# Patient Record
Sex: Male | Born: 1935 | Race: White | Hispanic: No | Marital: Married | Smoking: Never smoker
Health system: Southern US, Community
[De-identification: ages and names within clinical notes are randomized; demographics above are authoritative.]

## PROBLEM LIST (undated history)

## (undated) DIAGNOSIS — F0390 Unspecified dementia without behavioral disturbance: Secondary | ICD-10-CM

---

## 2019-10-09 ENCOUNTER — Other Ambulatory Visit: Payer: Self-pay | Admitting: Physician Assistant

## 2019-10-09 DIAGNOSIS — M4807 Spinal stenosis, lumbosacral region: Secondary | ICD-10-CM

## 2019-10-09 DIAGNOSIS — M545 Low back pain, unspecified: Secondary | ICD-10-CM

## 2019-10-20 ENCOUNTER — Ambulatory Visit
Admission: RE | Admit: 2019-10-20 | Discharge: 2019-10-20 | Disposition: A | Discharge status: Home / Self Care | Payer: Medicare Other | Source: Ambulatory Visit | Attending: Physician Assistant | Admitting: Physician Assistant

## 2019-10-20 ENCOUNTER — Other Ambulatory Visit: Payer: Self-pay

## 2019-10-20 DIAGNOSIS — M545 Low back pain, unspecified: Secondary | ICD-10-CM

## 2019-10-20 DIAGNOSIS — M4807 Spinal stenosis, lumbosacral region: Secondary | ICD-10-CM | POA: Insufficient documentation

## 2019-10-30 ENCOUNTER — Other Ambulatory Visit: Payer: Self-pay

## 2019-10-30 ENCOUNTER — Emergency Department
Admission: EM | Admit: 2019-10-30 | Discharge: 2019-10-30 | Disposition: A | Discharge status: Home / Self Care | Payer: Medicare Other | Attending: Emergency Medicine | Admitting: Emergency Medicine

## 2019-10-30 ENCOUNTER — Emergency Department: Payer: Medicare Other

## 2019-10-30 ENCOUNTER — Encounter: Payer: Self-pay | Admitting: Emergency Medicine

## 2019-10-30 DIAGNOSIS — Y998 Other external cause status: Secondary | ICD-10-CM | POA: Insufficient documentation

## 2019-10-30 DIAGNOSIS — Y9389 Activity, other specified: Secondary | ICD-10-CM | POA: Insufficient documentation

## 2019-10-30 DIAGNOSIS — S0990XA Unspecified injury of head, initial encounter: Secondary | ICD-10-CM | POA: Insufficient documentation

## 2019-10-30 DIAGNOSIS — Y92511 Restaurant or cafe as the place of occurrence of the external cause: Secondary | ICD-10-CM | POA: Insufficient documentation

## 2019-10-30 DIAGNOSIS — W010XXA Fall on same level from slipping, tripping and stumbling without subsequent striking against object, initial encounter: Secondary | ICD-10-CM | POA: Insufficient documentation

## 2019-10-30 DIAGNOSIS — F0390 Unspecified dementia without behavioral disturbance: Secondary | ICD-10-CM | POA: Insufficient documentation

## 2019-10-30 DIAGNOSIS — Z87891 Personal history of nicotine dependence: Secondary | ICD-10-CM | POA: Insufficient documentation

## 2019-10-30 NOTE — ED Provider Notes (Signed)
[facility] Emergency Department Provider Note ____________________________________________   First MD Initiated Contact with Patient 10/30/19 1258     (approximate)  I have reviewed the triage vital signs and the nursing notes.   HISTORY  Chief Complaint Fall  Level 5 caveat: History of present illness limited to due to dementia  HPI Ritter Helsley is a 86 y.o. male with a history of dementia who presents after a fall.  The wife states that she went for a walk with the patient and they went to a coffee shop.  The patient was attempting to get up onto a barstool and slipped off of it.  He broke his fall with a walker, but still hit his head on a table.  He had no loss of consciousness.  He has not had any change in mental status.  The patient reports some pain to the right side of his neck where he had a steroid injection yesterday, but denies other acute complaints.  The wife states that the patient has otherwise been feeling fine recently and this appears to be a mechanical fall.  History reviewed. No pertinent past medical history.  There are no problems to display for this patient.   History reviewed. No pertinent surgical history.  Prior to Admission medications   Not on File    Allergies Patient has no known allergies.  History reviewed. No pertinent family history.  Social History Social History   Tobacco Use  . Smoking status: Never Smoker  . Smokeless tobacco: Former Engineer, water Use Topics  . Alcohol use: Not on file  . Drug use: Not on file    Review of Systems Level 5 caveat: Unable to obtain review of systems due to dementia    ____________________________________________   PHYSICAL EXAM:  VITAL SIGNS: ED Triage Vitals  Enc Vitals Group     BP 10/30/19 1235 (!) 125/99     Pulse Rate 10/30/19 1235 84     Resp 10/30/19 1235 16     Temp 10/30/19 1235 98.2 F (36.8 C)     Temp Source 10/30/19 1235 Oral     SpO2  10/30/19 1235 98 %     Weight 10/30/19 1236 189 lb (85.7 kg)     Height 10/30/19 1236 5\' 9"  (1.753 m)     Head Circumference --      Peak Flow --      Pain Score 10/30/19 1236 5     Pain Loc --      Pain Edu? --      Excl. in GC? --     Constitutional: Alert, slightly confused.  Well appearing for age and in no acute distress. Eyes: Conjunctivae are normal.  EOMI.  PERRLA. Head: Atraumatic. Nose: No congestion/rhinnorhea. Mouth/Throat: Mucous membranes are moist.   Neck: Normal range of motion.  No midline cervical spinal tenderness. Cardiovascular: Normal rate, regular rhythm.  Good peripheral circulation. Respiratory: Normal respiratory effort.  No retractions.  Gastrointestinal: No distention.  Musculoskeletal: No lower extremity edema.  Full range of motion all extremities.  No midline spinal tenderness. Neurologic:  Normal speech and language.  Motor intact in all extremities.  Normal coordination with no ataxia on finger-to-nose. Skin:  Skin is warm and dry. No rash noted. Psychiatric: Calm and cooperative. ____________________________________________   LABS (all labs ordered are listed, but only abnormal results are displayed)  Labs Reviewed - No data to display ____________________________________________  EKG   ____________________________________________  RADIOLOGY  CT head:  No ICH CT cervical spine: No acute fracture  ____________________________________________   PROCEDURES  Procedure(s) performed: No  Procedures  Critical Care performed: No ____________________________________________   INITIAL IMPRESSION / ASSESSMENT AND PLAN / ED COURSE  Pertinent labs & imaging results that were available during my care of the patient were reviewed by me and considered in my medical decision making (see chart for details).  86 year old male with a history of dementia presents after mechanical fall from a stool in which he hit his head but had no LOC.  Per  the wife, he is currently at his baseline mental status.  The patient reports some neck pain after a steroid injection yesterday, but denies other acute complaints.  Per his wife, he has been feeling well and at his baseline in the last few days.  He is not on any anticoagulation or antiplatelet therapy.  On exam, the patient is very well-appearing for his age.  His vital signs are normal.  He is slightly confused but able to answer some questions and follow commands.  Neurologic exam is nonfocal.  He has no midline spinal tenderness, and no visible trauma.  Overall presentation is consistent with mechanical fall.  We have obtain CT head and C-spine.  There is no indication for lab work-up at this time.  ----------------------------------------- 2:38 PM on 10/30/2019 -----------------------------------------  CT head and C-spine were negative for acute findings.  The subcutaneous emphysema seen in the right sternocleidomastoid is consistent with the location of his steroid injection yesterday.  The patient has known chronic arthritis.  At this time, the patient is stable for discharge home.  I counseled the patient and his wife on the results of the imaging.  Return precautions given, and they expressed understanding.  ____________________________________________   FINAL CLINICAL IMPRESSION(S) / ED DIAGNOSES  Final diagnoses:  Minor head injury, initial encounter      NEW MEDICATIONS STARTED DURING THIS VISIT:  There are no discharge medications for this patient.    Note:  This document was prepared using Dragon voice recognition software and may include unintentional dictation errors.   Arta Silence, MD 10/30/19 1439

## 2019-10-30 NOTE — ED Notes (Signed)
E-signature not working at this time. Pt verbalized understanding of D/C instructions, prescriptions and follow up care with no further questions at this time. Pt in NAD and wheeled to lobby at time of D/C. Pt wife taking pt home.

## 2019-10-30 NOTE — Discharge Instructions (Addendum)
Return to the ER for new, worsening, or persistent severe headache or neck pain, confusion, change in mental status, vomiting, or any other new or worsening symptoms that concern you.

## 2019-10-30 NOTE — ED Triage Notes (Signed)
Pt presents via [facility] with c/o fall. Pt was sitting on bar stool at coffee shop and slipped while trying to get down from stool. Pt hit head with fall. No lacerations noted at this time. Pt confused, but per ems this is pt's baseline. Pt has hx of alzheimer's and confused at baseline. CBG 96. Pt has chronic neck pain per ems. VSS. Pt alert and oriented x2 and following commands at this time.

## 2019-10-30 NOTE — ED Notes (Signed)
Urine sample sent to lab

## 2020-08-02 ENCOUNTER — Other Ambulatory Visit: Payer: Self-pay

## 2020-08-02 ENCOUNTER — Emergency Department: Payer: Medicare Other

## 2020-08-02 ENCOUNTER — Emergency Department
Admission: EM | Admit: 2020-08-02 | Discharge: 2020-08-02 | Disposition: A | Discharge status: Home / Self Care | Payer: Medicare Other | Attending: Emergency Medicine | Admitting: Emergency Medicine

## 2020-08-02 DIAGNOSIS — S42402A Unspecified fracture of lower end of left humerus, initial encounter for closed fracture: Secondary | ICD-10-CM | POA: Diagnosis not present

## 2020-08-02 DIAGNOSIS — S59902A Unspecified injury of left elbow, initial encounter: Secondary | ICD-10-CM | POA: Diagnosis present

## 2020-08-02 DIAGNOSIS — F0390 Unspecified dementia without behavioral disturbance: Secondary | ICD-10-CM | POA: Insufficient documentation

## 2020-08-02 DIAGNOSIS — W19XXXA Unspecified fall, initial encounter: Secondary | ICD-10-CM | POA: Insufficient documentation

## 2020-08-02 DIAGNOSIS — R079 Chest pain, unspecified: Secondary | ICD-10-CM | POA: Insufficient documentation

## 2020-08-02 DIAGNOSIS — T1490XA Injury, unspecified, initial encounter: Secondary | ICD-10-CM

## 2020-08-02 DIAGNOSIS — R55 Syncope and collapse: Secondary | ICD-10-CM

## 2020-08-02 LAB — COMPREHENSIVE METABOLIC PANEL
ALT: 21 U/L (ref 0–44)
AST: 24 U/L (ref 15–41)
Albumin: 3.9 g/dL (ref 3.5–5.0)
Alkaline Phosphatase: 52 U/L (ref 38–126)
Anion gap: 11 (ref 5–15)
BUN: 13 mg/dL (ref 8–23)
CO2: 22 mmol/L (ref 22–32)
Calcium: 9.7 mg/dL (ref 8.9–10.3)
Chloride: 103 mmol/L (ref 98–111)
Creatinine, Ser: 0.69 mg/dL (ref 0.61–1.24)
GFR, Estimated: >60 mL/min (ref >60)
Glucose, Bld: 143 mg/dL — ABNORMAL HIGH (ref 70–99)
Potassium: 3.7 mmol/L (ref 3.5–5.1)
Sodium: 136 mmol/L (ref 135–145)
Total Bilirubin: 1.1 mg/dL (ref 0.3–1.2)
Total Protein: 7.8 g/dL (ref 6.5–8.1)

## 2020-08-02 LAB — CBC
HCT: 42.5 % (ref 39.0–52.0)
Hemoglobin: 14.3 g/dL (ref 13.0–17.0)
MCH: 32.3 pg (ref 26.0–34.0)
MCHC: 33.6 g/dL (ref 30.0–36.0)
MCV: 95.9 fL (ref 80.0–100.0)
Platelets: 243 10*3/uL (ref 150–400)
RBC: 4.43 MIL/uL (ref 4.22–5.81)
RDW: 12.8 % (ref 11.5–15.5)
WBC: 15.1 10*3/uL — ABNORMAL HIGH (ref 4.0–10.5)
nRBC: 0 % (ref 0.0–0.2)

## 2020-08-02 LAB — TROPONIN I (HIGH SENSITIVITY): Troponin I (High Sensitivity): 13 ng/L (ref <18)

## 2020-08-02 MED ORDER — HYDROCODONE-ACETAMINOPHEN 5-325 MG PO TABS: 2.0000 | ORAL_TABLET | Freq: Four times a day (QID) | ORAL | 0 refills | Status: AC | PRN

## 2020-08-02 MED ORDER — ACETAMINOPHEN 500 MG PO TABS
1000.0000 mg | ORAL_TABLET | Freq: Once | ORAL | Status: AC
  Administered 2020-08-02: 1000 mg via ORAL
  Filled 2020-08-02: qty 2

## 2020-08-02 MED ORDER — HYDROMORPHONE HCL 1 MG/ML IJ SOLN
0.5000 mg | Freq: Once | INTRAMUSCULAR | Status: DC
  Filled 2020-08-02: qty 1

## 2020-08-02 NOTE — ED Triage Notes (Signed)
Pt from [facility] with possible fall after possible syncopal episode. Pt states he was having chest pain, then may have had a syncopal episode that led to a fall. Pt complains of severe left shoulder and elbow pain, pt tender on palpation to entire left arm. Pt with demetia.

## 2020-08-02 NOTE — ED Provider Notes (Signed)
[facility] Emergency Department Provider Note  ____________________________________________   Event Date/Time   First MD Initiated Contact with Patient 08/02/20 [phone]     (approximate)  I have reviewed the triage vital signs and the nursing notes.   HISTORY  Chief Complaint Fall   HPI Stanley Sutton is a 87 y.o. male with a past medical history of dementia who presents for assessment after concern for fall preceded by possible syncopal episode associated with some chest pain.  Patient is unable to find any history on arrival secondary to his dementia but does note he has some pain in his left arm.  He is accompanied by his wife who confirmed that he is typically not oriented and not be able to recall recent events.  He is not on any blood thinners.  I did reach out to staff at the [facility] where patient is a resident is seated patient's blood pressure was high and you are not sure if he passed out or was having chest pain but called EMS.  No additional history is available on patient arrival.       No past medical history on file.  There are no problems to display for this patient.   No past surgical history on file.  Prior to Admission medications   Medication Sig Start Date End Date Taking? Authorizing Provider  HYDROcodone-acetaminophen (NORCO/VICODIN) 5-325 MG tablet Take 2 tablets by mouth every 6 (six) hours as needed for up to 5 days for severe pain. 08/02/20 08/07/20 Yes Gilles Chiquito, MD    Allergies Patient has no known allergies.  No family history on file.  Social History Social History   Tobacco Use  . Smoking status: Never Smoker  . Smokeless tobacco: Former Neurosurgeon    Review of Systems  Review of Systems  Unable to perform ROS: Dementia      ____________________________________________   PHYSICAL EXAM:  VITAL SIGNS: ED Triage Vitals [08/02/20 0256]  Enc Vitals Group     BP (!) 179/82     Pulse Rate 81     Resp  (!) 22     Temp 98.1 F (36.7 C)     Temp Source Oral     SpO2 96 %     Weight 200 lb (90.7 kg)     Height 5\' 9"  (1.753 m)     Head Circumference      Peak Flow      Pain Score 10     Pain Loc      Pain Edu?      Excl. in GC?    Vitals:   08/02/20 0256  BP: (!) 179/82  Pulse: 81  Resp: (!) 22  Temp: 98.1 F (36.7 C)  SpO2: 96%   Physical Exam Vitals and nursing note reviewed.  Constitutional:      Appearance: He is well-developed and well-nourished.  HENT:     Head: Normocephalic and atraumatic.     Right Ear: External ear normal.     Left Ear: External ear normal.  Eyes:     Conjunctiva/sclera: Conjunctivae normal.  Cardiovascular:     Rate and Rhythm: Normal rate and regular rhythm.     Heart sounds: No murmur heard.   Pulmonary:     Effort: Pulmonary effort is normal. No respiratory distress.     Breath sounds: Normal breath sounds.  Abdominal:     Palpations: Abdomen is soft.     Tenderness: There is no abdominal tenderness.  Musculoskeletal:        General: No edema.     Cervical back: Neck supple.  Skin:    General: Skin is warm and dry.  Neurological:     Mental Status: He is alert. Mental status is at baseline. He is disoriented and confused.  Psychiatric:        Mood and Affect: Mood and affect normal.     No step-offs tenderness deformities over the C/T/L-spine.  Patient has full strength throughout his right upper extremity and bilateral lower extremities.  Sensation is intact throughout his bilateral upper and lower extremities.  2+ bilateral radial pulses.  Patient refuses to follow any commands or move his left upper extremity at the elbow, shoulder, wrist.  He does withdraw to pain.  Cranial nerves II through XII grossly intact.  No obvious trauma to the patient's right upper extremity, by lower extremities, chest, abdomen, face or neck. ____________________________________________   LABS (all labs ordered are listed, but only abnormal results  are displayed)  Labs Reviewed  CBC - Abnormal; Notable for the following components:      Result Value   WBC 15.1 (*)    All other components within normal limits  COMPREHENSIVE METABOLIC PANEL - Abnormal; Notable for the following components:   Glucose, Bld 143 (*)    All other components within normal limits  URINALYSIS, COMPLETE (UACMP) WITH MICROSCOPIC  TROPONIN I (HIGH SENSITIVITY)  TROPONIN I (HIGH SENSITIVITY)   ____________________________________________  EKG  Sinus rhythm with first-degree AV block with PR interval of 222, normal axis, unremarkable intervals, and significant artifact throughout including in V3, V4 with otherwise nonspecific changes in the ST leads and inferior portions. ____________________________________________  RADIOLOGY  ED MD interpretation: CT head and C-spine showed no evidence of acute bony injury or intracranial hemorrhage.  No evidence of rib fracture or pneumothorax or other acute intrathoracic process on patient's chest x-ray or evidence of any pelvic injury on pelvic x-ray.  Plain films of the patient's left wrist, left humerus and left shoulder unremarkable for evidence of fracture or dislocation.  X-ray of the patient's left elbow is concerning for likely radial neck fracture.  Official radiology report(s): DG Chest 1 View  Result Date: 08/02/2020 CLINICAL DATA:  Syncope EXAM: CHEST  1 VIEW COMPARISON:  None. FINDINGS: Normal heart size and mediastinal contours. No acute infiltrate or edema. No effusion or pneumothorax. No acute osseous findings. Wide bilateral AC joint presumably related to prior resection. Glenohumeral osteoarthritis and degenerative endplate spurring. IMPRESSION: No evidence of acute cardiopulmonary disease. Electronically Signed   By: Marnee Spring M.D.   On: 08/02/2020 04:32   DG Elbow 2 Views Left  Result Date: 08/02/2020 CLINICAL DATA:  Fall, syncopal episode EXAM: LEFT ELBOW - 2 VIEW COMPARISON:  None. FINDINGS:  Suspect an impacted fracture of the radial head-neck junction as well as conspicuous widening of the radiocapitellar joint space. Exuberant mineralization is noted along both the anterior and posterior aspect of the distal humerus which is favored to reflect a chronic process or remote posttraumatic change. The presence of these findings does somewhat limit assessment for a distal radial fracture line. Additional heterogeneous mineralization is seen posteriorly with some elevation of the fat pad, likely reflecting a joint body. Some mild soft tissue swelling of the elbow and elbow joint effusion. IMPRESSION: 1. Suspect an impacted fracture of the radial head-neck junction with widening of the radiocapitellar joint space. Elbow effusion and swelling. 2. Exuberant mineralization along the anterior and posterior aspect  of the distal humerus which is favored to reflect a chronic process or remote posttraumatic change. 3. Mineralized joint body posteriorly, etiology unclear though early chondromatosis could have this appearance. Electronically Signed   By: Kreg Shropshire M.D.   On: 08/02/2020 04:34   DG Wrist Complete Left  Result Date: 08/02/2020 CLINICAL DATA:  Fall EXAM: LEFT WRIST - COMPLETE 3+ VIEW COMPARISON:  None. FINDINGS: No acute bony abnormality. Specifically, no fracture, subluxation, or dislocation. Extensive chondrocalcinosis about the wrist particularly along the proximal row and within the triangular fibrocartilage. Rounded lucency within the lunate likely reflects intraosseous ganglion formation, a typically benign incidental finding. Diffuse degenerative changes noted throughout the wrist most pronounced at the triscaphe, first carpometacarpal and first metacarpophalangeal joints. Mild soft tissue swelling of the wrist. No soft tissue gas or foreign body. Vascular calcium is noted in the soft tissues as well. IMPRESSION: 1. No acute osseous abnormality. 2. Extensive chondrocalcinosis about the wrist,  most pronounced along the proximal row and within the triangular fibrocartilage. Could reflect underlying CPPD arthropathy. 3. Diffuse degenerative changes throughout the wrist most pronounced at the triscaphe, first carpometacarpal and first metacarpophalangeal joints. Electronically Signed   By: Kreg Shropshire M.D.   On: 08/02/2020 05:03   CT Head Wo Contrast  Result Date: 08/02/2020 CLINICAL DATA:  Fall after syncopal episode EXAM: CT HEAD WITHOUT CONTRAST CT CERVICAL SPINE WITHOUT CONTRAST TECHNIQUE: Multidetector CT imaging of the head and cervical spine was performed following the standard protocol without intravenous contrast. Multiplanar CT image reconstructions of the cervical spine were also generated. COMPARISON:  CT head and cervical spine 10/30/2019 FINDINGS: CT HEAD FINDINGS Brain: No evidence of acute infarction, hemorrhage, hydrocephalus, extra-axial collection, visible mass lesion or mass effect. Symmetric prominence of the ventricles, cisterns and sulci compatible with parenchymal volume loss. Patchy areas of white matter hypoattenuation are most compatible with chronic microvascular angiopathy. Stable benign dural calcifications. Basal cisterns are patent. Midline intracranial structures are unremarkable. Cerebellar tonsils are normally positioned. Vascular: Atherosclerotic calcification of the carotid siphons and intradural vertebral arteries. No hyperdense vessel. Skull: No calvarial fracture or suspicious osseous lesion. No scalp swelling or hematoma. Sinuses/Orbits: Paranasal sinuses and mastoid air cells are predominantly clear. Orbital structures are unremarkable aside from prior lens extractions. Other: Debris in the left external auditory canal. CT CERVICAL SPINE FINDINGS Alignment: Straightening of the normal cervical lordosis. Minimal anterolisthesis C2-3 and stepwise anterolisthesis C6-T1 are favored to be on a degenerative basis with multilevel spondylitic changes as detailed below.  No evidence of traumatic listhesis. No abnormally widened, perched or jumped facets. Normal alignment of the craniocervical and atlantoaxial articulations. Skull base and vertebrae: No acute skull base fracture. No vertebral body fracture or height loss. Normal bone mineralization. Some erosive changes about the dens and anterior arch C1 as well as pannus formation are nonspecific though suggestive of an underlying erosive arthropathy including rheumatoid or crystalline arthropathies. Multilevel cervical spondylitic changes are better detailed below. Mineralization is adjacent the cervicothoracic spinous processes is favored to be enthesopathic in nature. Soft tissues and spinal canal: No pre or paravertebral fluid or swelling. No visible canal hematoma. Airways patent. Cervical carotid atherosclerosis. Disc levels: Multilevel intervertebral disc height loss with spondylitic endplate changes. Multilevel disc osteophyte complexes are seen throughout the cervical levels largest C3-C6 resulting in some effacement of the ventral thecal sac without significant canal impingement. Uncinate spurring and facet hypertrophic changes are present as well resulting in mild-to-moderate multilevel neural foraminal narrowing with more moderate to severe narrowing on  the right C4-5 and C6-7. Upper chest: No acute abnormality in the upper chest or imaged lung apices. Additional calcification in the proximal great vessels is noted. Other: No concerning thyroid nodules or masses. IMPRESSION: 1. No evidence of acute intracranial abnormality. No significant scalp swelling or hematoma. No calvarial fracture. 2. Stable parenchymal volume loss and chronic microvascular ischemic white matter disease. 3. Debris in the left external auditory canal, correlate for cerumen impaction. 4. No evidence of acute fracture or traumatic listhesis of the cervical spine. 5. Multilevel spondylitic and facet hypertrophic changes of the cervical spine, as  described above. 6. Some erosive changes about the dens and anterior arch C1 with pannus formation. Nonspecific though suggestive of an underlying erosive arthropathy including rheumatoid or crystalline arthropathies. 7. Cervical and intracranial atherosclerosis. Electronically Signed   By: Kreg Shropshire M.D.   On: 08/02/2020 04:18   CT Cervical Spine Wo Contrast  Result Date: 08/02/2020 CLINICAL DATA:  Fall after syncopal episode EXAM: CT HEAD WITHOUT CONTRAST CT CERVICAL SPINE WITHOUT CONTRAST TECHNIQUE: Multidetector CT imaging of the head and cervical spine was performed following the standard protocol without intravenous contrast. Multiplanar CT image reconstructions of the cervical spine were also generated. COMPARISON:  CT head and cervical spine 10/30/2019 FINDINGS: CT HEAD FINDINGS Brain: No evidence of acute infarction, hemorrhage, hydrocephalus, extra-axial collection, visible mass lesion or mass effect. Symmetric prominence of the ventricles, cisterns and sulci compatible with parenchymal volume loss. Patchy areas of white matter hypoattenuation are most compatible with chronic microvascular angiopathy. Stable benign dural calcifications. Basal cisterns are patent. Midline intracranial structures are unremarkable. Cerebellar tonsils are normally positioned. Vascular: Atherosclerotic calcification of the carotid siphons and intradural vertebral arteries. No hyperdense vessel. Skull: No calvarial fracture or suspicious osseous lesion. No scalp swelling or hematoma. Sinuses/Orbits: Paranasal sinuses and mastoid air cells are predominantly clear. Orbital structures are unremarkable aside from prior lens extractions. Other: Debris in the left external auditory canal. CT CERVICAL SPINE FINDINGS Alignment: Straightening of the normal cervical lordosis. Minimal anterolisthesis C2-3 and stepwise anterolisthesis C6-T1 are favored to be on a degenerative basis with multilevel spondylitic changes as detailed  below. No evidence of traumatic listhesis. No abnormally widened, perched or jumped facets. Normal alignment of the craniocervical and atlantoaxial articulations. Skull base and vertebrae: No acute skull base fracture. No vertebral body fracture or height loss. Normal bone mineralization. Some erosive changes about the dens and anterior arch C1 as well as pannus formation are nonspecific though suggestive of an underlying erosive arthropathy including rheumatoid or crystalline arthropathies. Multilevel cervical spondylitic changes are better detailed below. Mineralization is adjacent the cervicothoracic spinous processes is favored to be enthesopathic in nature. Soft tissues and spinal canal: No pre or paravertebral fluid or swelling. No visible canal hematoma. Airways patent. Cervical carotid atherosclerosis. Disc levels: Multilevel intervertebral disc height loss with spondylitic endplate changes. Multilevel disc osteophyte complexes are seen throughout the cervical levels largest C3-C6 resulting in some effacement of the ventral thecal sac without significant canal impingement. Uncinate spurring and facet hypertrophic changes are present as well resulting in mild-to-moderate multilevel neural foraminal narrowing with more moderate to severe narrowing on the right C4-5 and C6-7. Upper chest: No acute abnormality in the upper chest or imaged lung apices. Additional calcification in the proximal great vessels is noted. Other: No concerning thyroid nodules or masses. IMPRESSION: 1. No evidence of acute intracranial abnormality. No significant scalp swelling or hematoma. No calvarial fracture. 2. Stable parenchymal volume loss and chronic microvascular ischemic white matter  disease. 3. Debris in the left external auditory canal, correlate for cerumen impaction. 4. No evidence of acute fracture or traumatic listhesis of the cervical spine. 5. Multilevel spondylitic and facet hypertrophic changes of the cervical spine,  as described above. 6. Some erosive changes about the dens and anterior arch C1 with pannus formation. Nonspecific though suggestive of an underlying erosive arthropathy including rheumatoid or crystalline arthropathies. 7. Cervical and intracranial atherosclerosis. Electronically Signed   By: Kreg ShropshirePrice  DeHay M.D.   On: 08/02/2020 04:18   DG Pelvis Portable  Result Date: 08/02/2020 CLINICAL DATA:  Fall, possible syncopal episode EXAM: PORTABLE PELVIS 1-2 VIEWS COMPARISON:  None. FINDINGS: The right hip is in external rotation which superimposes the greater trochanter for the femoral neck. No clear acute fracture of the right or left proximal femur is seen. Femoral heads remain normally located. Bones of the pelvis appear intact and congruent. Degenerative changes noted in the lower lumbar spine, SI joints and bilateral hips. Enthesopathic changes about the pelvis and bilateral femoral trochanters as well. Vascular calcium in the soft tissues. Normal bowel gas pattern. No acute or suspicious osseous lesions. IMPRESSION: 1. No acute osseous abnormalities. 2. Degenerative changes in the spine, SI joints and bilateral hips. 3. Atherosclerotic vascular disease. Electronically Signed   By: Kreg ShropshirePrice  DeHay M.D.   On: 08/02/2020 05:05   DG Hand 2 View Left  Result Date: 08/02/2020 CLINICAL DATA:  Fall, left hand pain, dementia EXAM: LEFT HAND - 2 VIEW COMPARISON:  Same day wrist radiographs. FINDINGS: Metallic ring is noted on the fourth digit, a watch is also noted about the wrist, not removed prior to imaging. No acute fracture or traumatic malalignment is seen. Diffuse interphalangeal joint space narrowing is noted. Additional degenerative changes noted throughout the wrist, better detailed on dedicated wrist radiographs. Further degenerative features are also noted at the first meta and second metacarpophalangeal joints as well as throughout the interphalangeal joints most notably at the first interphalangeal joint and  second and third DIP. Redemonstrated chondrocalcinosis about the wrist. No significant soft tissue swelling, focal nodularity or concerning soft tissue lesions. IMPRESSION: 1. No acute fracture or traumatic malalignment. 2. Diffuse degenerative changes of the hand and wrist, the former detailed above and the latter better detailed on dedicated wrist radiograph. Electronically Signed   By: Kreg ShropshirePrice  DeHay M.D.   On: 08/02/2020 05:44   DG Shoulder Left Port  Result Date: 08/02/2020 CLINICAL DATA:  Fall after syncope EXAM: LEFT SHOULDER COMPARISON:  None. FINDINGS: No acute fracture or dislocation. Soft tissue anchors to the proximal humerus a 3 mm metallic density overlapping the lower glenohumeral joint on the frontal views, presumably obscured on the lateral view. None of the other anchors appear fractured/fragmented to suggest this is an acute finding. Negative left upper ribs. IMPRESSION: 1. No acute fracture or dislocation. 2. Postoperative shoulder. Electronically Signed   By: Marnee SpringJonathon  Watts M.D.   On: 08/02/2020 04:31   DG Humerus Left  Result Date: 08/02/2020 CLINICAL DATA:  Fall after syncope. EXAM: LEFT HUMERUS - 2+ VIEW COMPARISON:  None. FINDINGS: No acute fracture or dislocation. Soft tissue anchors to the proximal humerus. One metallic density overlaps the anterior glenohumeral joint/bicipital groove with no available comparisons. Advanced elbow osteoarthritis with bulky spurring and loose bodies. Osteopenia. IMPRESSION: 1. No acute fracture or dislocation. 2. Advanced elbow osteoarthritis. Electronically Signed   By: Marnee SpringJonathon  Watts M.D.   On: 08/02/2020 04:30    ____________________________________________   PROCEDURES  Procedure(s) performed (including Critical Care):  .Marland Kitchen  Ortho Injury Treatment  Date/Time: 08/02/2020 6:04 AM Performed by: Gilles ChiquitoSmith, Sagan Wurzel P, MD Authorized by: Gilles ChiquitoSmith, Mohannad Olivero P, MD   Consent:    Consent obtained:  Verbal   Consent given by:  Patient   Alternatives  discussed:  Delayed treatmentInjury location: elbow Location details: left elbow Injury type: fracture Pre-procedure distal perfusion: normal Pre-procedure neurological function: normal Pre-procedure range of motion: reduced  Anesthesia: Local anesthesia used: no  Patient sedated: NoManipulation performed: no Immobilization: splint and sling Splint type: sugar tong Supplies used: cotton padding,  elastic bandage and Ortho-Glass Post-procedure neurovascular assessment: post-procedure neurovascularly intact Post-procedure distal perfusion: normal Post-procedure neurological function: normal Post-procedure range of motion: unchanged Patient tolerance: patient tolerated the procedure well with no immediate complications      ____________________________________________   INITIAL IMPRESSION / ASSESSMENT AND PLAN / ED COURSE      Patient presents with above-stated history and exam for assessment of concern for elevated blood pressure possible syncope since with some chest pain and possible unwitnessed fall.  Patient is hypertensive with a BP of 179/82 with otherwise stable vital signs on arrival.  He does have significant pain on any passive range of motion of the left arm and refuses to move it but otherwise has no obvious evidence of trauma and is neurovascular intact in his right upper extremity and bilateral lower extremities.  He is at his neurological baseline per wife who is at bedside and no other obvious in the presence of injury on exam.  No findings on exam to suggest acute infectious process.  Very low suspicion for toxic ingestion.  CBC has leukocytosis but normal hemoglobin and platelets.  This could certainly be reactive in the setting of possible trauma.  CMP shows no significant ocular metabolic derangements.  Troponin is nonelevated and given patient denies any chest pain low suspicion for ACS or myocarditis.  Low suspicion for PE, patient denies any chest pain or  shortness of breath and has no hypoxia or tachycardia or clear risk factors.  Unclear if patient had a syncopal episode or not although given otherwise reassuring EKG labs and chest x-ray I believe he is safe for discharge back to his nursing facility for further outpatient evaluation from this perspective.  With regard to possible fall concern for left elbow fracture as noted above.  No significant dislocation and patient is neurovascular intact distally.  Discussed fracture with on-call orthopedist Dr. Hyacinth MeekerMiller who reviewed images and recommended sugar tong splinting with outpatient follow-up.  Patient placed in sugar tong splint.  Low suspicion at this time for other significant occult orthopedic or visceral injury.  He states his pain is much improved on my reassessment after below noted analgesia.  Patient discharged stable condition.  Strict return precautions advised and discussed.  Plan is to follow-up with orthopedic clinic in 5 days.  Rx written for analgesia.   ____________________________________________   FINAL CLINICAL IMPRESSION(S) / ED DIAGNOSES  Final diagnoses:  Injury  Closed fracture of left elbow, initial encounter  Dementia without behavioral disturbance, unspecified dementia type (HCC)  Fall, initial encounter    Medications  HYDROmorphone (DILAUDID) injection 0.5 mg (has no administration in time range)  acetaminophen (TYLENOL) tablet 1,000 mg (1,000 mg Oral Given 08/02/20 0433)     ED Discharge Orders         Ordered    HYDROcodone-acetaminophen (NORCO/VICODIN) 5-325 MG tablet  Every 6 hours PRN        08/02/20 0525  Note:  This document was prepared using Dragon voice recognition software and may include unintentional dictation errors.   Gilles Chiquito, MD 08/02/20 [phone]

## 2020-08-02 NOTE — ED Notes (Signed)
Wife called [facility] to update and ask about pt belongings as pt arrived only socks, brief, gown, white undershirt, and cloth arm sling.

## 2021-05-11 ENCOUNTER — Other Ambulatory Visit: Payer: Self-pay

## 2021-05-11 ENCOUNTER — Emergency Department: Payer: Medicare Other

## 2021-05-11 ENCOUNTER — Encounter: Payer: Self-pay | Admitting: Radiology

## 2021-05-11 DIAGNOSIS — F02C3 Dementia in other diseases classified elsewhere, severe, with mood disturbance: Secondary | ICD-10-CM | POA: Diagnosis present

## 2021-05-11 DIAGNOSIS — R531 Weakness: Secondary | ICD-10-CM | POA: Diagnosis present

## 2021-05-11 DIAGNOSIS — I4891 Unspecified atrial fibrillation: Secondary | ICD-10-CM | POA: Diagnosis present

## 2021-05-11 DIAGNOSIS — F02C4 Dementia in other diseases classified elsewhere, severe, with anxiety: Secondary | ICD-10-CM | POA: Diagnosis present

## 2021-05-11 DIAGNOSIS — I214 Non-ST elevation (NSTEMI) myocardial infarction: Principal | ICD-10-CM | POA: Diagnosis present

## 2021-05-11 DIAGNOSIS — G309 Alzheimer's disease, unspecified: Secondary | ICD-10-CM | POA: Diagnosis present

## 2021-05-11 DIAGNOSIS — F32A Depression, unspecified: Secondary | ICD-10-CM | POA: Diagnosis present

## 2021-05-11 DIAGNOSIS — F0280 Dementia in other diseases classified elsewhere without behavioral disturbance: Secondary | ICD-10-CM

## 2021-05-11 DIAGNOSIS — Z20822 Contact with and (suspected) exposure to covid-19: Secondary | ICD-10-CM | POA: Diagnosis present

## 2021-05-11 DIAGNOSIS — Z79899 Other long term (current) drug therapy: Secondary | ICD-10-CM

## 2021-05-11 DIAGNOSIS — F02C11 Dementia in other diseases classified elsewhere, severe, with agitation: Secondary | ICD-10-CM | POA: Diagnosis present

## 2021-05-11 DIAGNOSIS — Z66 Do not resuscitate: Secondary | ICD-10-CM | POA: Diagnosis present

## 2021-05-11 DIAGNOSIS — W19XXXA Unspecified fall, initial encounter: Secondary | ICD-10-CM

## 2021-05-11 DIAGNOSIS — R52 Pain, unspecified: Secondary | ICD-10-CM | POA: Diagnosis not present

## 2021-05-11 DIAGNOSIS — E785 Hyperlipidemia, unspecified: Secondary | ICD-10-CM | POA: Diagnosis present

## 2021-05-11 DIAGNOSIS — S40021A Contusion of right upper arm, initial encounter: Secondary | ICD-10-CM

## 2021-05-11 DIAGNOSIS — M79601 Pain in right arm: Secondary | ICD-10-CM | POA: Diagnosis present

## 2021-05-11 DIAGNOSIS — Z87891 Personal history of nicotine dependence: Secondary | ICD-10-CM

## 2021-05-11 LAB — CBC
HCT: 42.4 % (ref 39.0–52.0)
Hemoglobin: 14.5 g/dL (ref 13.0–17.0)
MCH: 32.3 pg (ref 26.0–34.0)
MCHC: 34.2 g/dL (ref 30.0–36.0)
MCV: 94.4 fL (ref 80.0–100.0)
Platelets: 243 10*3/uL (ref 150–400)
RBC: 4.49 MIL/uL (ref 4.22–5.81)
RDW: 13.3 % (ref 11.5–15.5)
WBC: 11.2 10*3/uL — ABNORMAL HIGH (ref 4.0–10.5)
nRBC: 0 % (ref 0.0–0.2)

## 2021-05-11 LAB — BASIC METABOLIC PANEL
Anion gap: 13 (ref 5–15)
BUN: 14 mg/dL (ref 8–23)
CO2: 25 mmol/L (ref 22–32)
Calcium: 9.9 mg/dL (ref 8.9–10.3)
Chloride: 98 mmol/L (ref 98–111)
Creatinine, Ser: 0.79 mg/dL (ref 0.61–1.24)
GFR, Estimated: >60 mL/min (ref >60)
Glucose, Bld: 165 mg/dL — ABNORMAL HIGH (ref 70–99)
Potassium: 3.4 mmol/L — ABNORMAL LOW (ref 3.5–5.1)
Sodium: 136 mmol/L (ref 135–145)

## 2021-05-11 LAB — TROPONIN I (HIGH SENSITIVITY): Troponin I (High Sensitivity): 21 ng/L — ABNORMAL HIGH (ref <18)

## 2021-05-11 MED ORDER — FENTANYL CITRATE PF 50 MCG/ML IJ SOSY: 12.5000 ug | PREFILLED_SYRINGE | Freq: Once | INTRAMUSCULAR | Status: DC

## 2021-05-11 MED ORDER — IBUPROFEN 600 MG PO TABS
600.0000 mg | ORAL_TABLET | ORAL | Status: AC
  Filled 2021-05-11: qty 1

## 2021-05-11 NOTE — ED Provider Notes (Signed)
Emergency Medicine Provider Triage Evaluation Note  Stanley Sutton , a 87 y.o. male  was evaluated in triage.  Pt complains of right arm/shoulder after unwitnessed fall some time overnight. Stanley Sutton feels he is more lethargic than usual. Patient with history of dementia. Guarding right arm/hand/wrist and leaning toward the right side.  Review of Systems  Positive: Right hand, shoulder, wrist pain Negative: Obvious deformity to extremities or open wounds.  Physical Exam  BP (!) 169/59 (BP Location: Left Arm)   Pulse 78   Temp 98.9 F (37.2 C) (Oral)   Resp 16   Ht 5\' 9"  (1.753 m)   Wt 84.4 kg   SpO2 98%   BMI 27.47 kg/m  Gen:   Awake, no distress   Resp:  Normal effort  MSK:   Moves extremities without difficulty  Other:    Medical Decision Making  Medically screening exam initiated at 1:15 PM.  Appropriate orders placed.  Stanley Sutton was informed that the remainder of the evaluation will be completed by another provider, this initial triage assessment does not replace that evaluation, and the importance of remaining in the ED until their evaluation is complete.    Stanley Jeans, FNP 05/11/21 1342    05/13/21, MD 05/11/21 [phone]

## 2021-05-11 NOTE — ED Triage Notes (Signed)
Pt arrives via ems from [facility], ems reports that the pt had fallen this am and was found by staff, pt's wife states that he appears more lethargic than usual and seems to be guarding his right side, pt is leaning to the right in the recliner, pt was placed in the recliner from the ems stretcher by ems, pt has a hx of dementia and pt's wife states that someone reported that he was this way on Saturday as well

## 2021-05-11 NOTE — ED Provider Notes (Signed)
[facility] Emergency Department Provider Note   ____________________________________________   Event Date/Time   First MD Initiated Contact with Patient 05/11/21 [phone]     (approximate)  I have reviewed the triage vital signs and the nursing notes.   HISTORY  Chief Complaint Fall and Fatigue  EM caveat: Dementia poor historian.  Wife provides history  HPI Stanley Sutton is a 87 y.o. male resides at care facility.  He normally up ambulatory, does have fairly severe dementia.  Wife reports the facility had seen that he had fallen today.  Unclear the exact circumstance.  However he is complaining of pain over the right side in particular seems to have difficulty with his right arm wife also notices that he seems weak like he does not want to use his right leg and his right arm both seem to be weak today.  He was last seen normal by his wife really about a week ago and she reports that a few days ago she also got a call that they are concerned about some similar weakness at his care facility but he had not fallen at that point  Fall occurred this morning  His wife is concerned he could have possibly had "a stroke" as he seems weak over the right side but also complaining of arm pain and somewhere in the right arm area  Patient himself unable to give history.  He talks about things such as being in [employer]. but nothing that is pertinent to his visit  History reviewed. No pertinent past medical history.  There are no problems to display for this patient.   No past surgical history on file.  Prior to Admission medications   Not on File    Allergies Patient has no known allergies.  No family history on file.  Social History Social History   Tobacco Use   Smoking status: Never   Smokeless tobacco: Former    Review of Systems  EM caveat  Wife reports he had some sort of weakness or has not been himself for about 1 week's time.  He  was to be increasingly weak more confused than his typical   ____________________________________________   PHYSICAL EXAM:  VITAL SIGNS: ED Triage Vitals  Enc Vitals Group     BP 05/11/21 1255 (!) 169/59     Pulse Rate 05/11/21 1255 78     Resp 05/11/21 1255 16     Temp 05/11/21 1255 98.9 F (37.2 C)     Temp Source 05/11/21 1255 Oral     SpO2 05/11/21 1255 98 %     Weight 05/11/21 1301 186 lb (84.4 kg)     Height 05/11/21 1301 5\' 9"  (1.753 m)     Head Circumference --      Peak Flow --      Pain Score 05/11/21 1300 10     Pain Loc --      Pain Edu? --      Excl. in GC? --     Constitutional: Alert and sitting up in wheelchair, disoriented recognizes wife.  Talks frequently of military experience Eyes: Conjunctivae are normal. Head: Atraumatic.  No cervical tenderness Nose: No congestion/rhinnorhea. Mouth/Throat: Mucous membranes are moist. Neck: No stridor.  Cardiovascular: Normal rate, regular rhythm. Grossly normal heart sounds.  Good peripheral circulation. Respiratory: Normal respiratory effort.  No retractions. Lungs CTAB. Gastrointestinal: Soft and nontender. No distention. Musculoskeletal: No lower extremity tenderness nor edema.  Good range of motion of  the left side follows commands on the left side without deficits or long bone tenderness or joint tenderness. Patient on the right side seems to exhibit limitation in range of motion right upper extremity secondary to pain, he states he has notable tenderness around his right elbow that seems slightly swollen.  Denies pain across the hand upper shoulder but seems to focus pain around his right hand using his left arm to sort of splint his right arm at the elbow.  No open injury.  No obvious gross deformity  Neurologic:  Normal speech and language. No gross focal neurologic deficits are appreciated.  Skin:  Skin is warm, dry and intact. No rash noted. Psychiatric: Mood and affect are normal. Speech and behavior are  normal.  ____________________________________________   LABS (all labs ordered are listed, but only abnormal results are displayed)  Labs Reviewed  BASIC METABOLIC PANEL - Abnormal; Notable for the following components:      Result Value   Potassium 3.4 (*)    Glucose, Bld 165 (*)    All other components within normal limits  CBC - Abnormal; Notable for the following components:   WBC 11.2 (*)    All other components within normal limits  TROPONIN I (HIGH SENSITIVITY) - Abnormal; Notable for the following components:   Troponin I (High Sensitivity) 21 (*)    All other components within normal limits  URINALYSIS, COMPLETE (UACMP) WITH MICROSCOPIC  CBG MONITORING, ED   ____________________________________________  EKG  EKG reviewed at 1130 Heart rate 99 QRS 89 QTc 469 Rhythm hard to determine, may represent A. fib, no evidence of acute ischemia.  Extremely difficult to obtain EKG with a clean baseline however ____________________________________________  RADIOLOGY  DG Shoulder Right  Result Date: 05/11/2021 CLINICAL DATA:  Fall, pain EXAM: RIGHT SHOULDER - 2+ VIEW COMPARISON:  None. FINDINGS: Degenerative changes in the right shoulder with joint space narrowing and spurring. Prior resection of the distal clavicle. No acute bony abnormality. Specifically, no fracture, subluxation, or dislocation. IMPRESSION: Advanced degenerative changes.  No acute bony abnormality. Electronically Signed   By: Charlett Nose M.D.   On: 05/11/2021 20:18   DG Forearm Right  Result Date: 05/11/2021 CLINICAL DATA:  Fall, pain EXAM: RIGHT FOREARM - 2 VIEW COMPARISON:  None. FINDINGS: Advanced degenerative changes at the right elbow. Extensive spurring. Intra-articular loose bodies present. No visible fracture. No subluxation or dislocation. IMPRESSION: No acute bony abnormality. Electronically Signed   By: Charlett Nose M.D.   On: 05/11/2021 20:17   DG Wrist Complete Right  Result Date:  05/11/2021 CLINICAL DATA:  Fall, pain EXAM: RIGHT WRIST - COMPLETE 3+ VIEW COMPARISON:  None. FINDINGS: Mild degenerative changes in the wrist. No acute bony abnormality. Specifically, no fracture, subluxation, or dislocation. IMPRESSION: No acute bony abnormality. Electronically Signed   By: Charlett Nose M.D.   On: 05/11/2021 20:17   CT HEAD WO CONTRAST  Result Date: 05/11/2021 CLINICAL DATA:  Status post fall.  87 year old male. EXAM: CT HEAD WITHOUT CONTRAST TECHNIQUE: Contiguous axial images were obtained from the base of the skull through the vertex without intravenous contrast. COMPARISON:  August 02, 2020. FINDINGS: Brain: No evidence of acute infarction, hemorrhage, hydrocephalus, extra-axial collection or mass lesion/mass effect. Signs of atrophy and chronic microvascular ischemic change as before. Vascular: No hyperdense vessel or unexpected calcification. Skull: Normal. Negative for fracture or focal lesion. Sinuses/Orbits: Visualized paranasal sinuses and orbits are unremarkable. Other: None IMPRESSION: No acute intracranial pathology. Signs of atrophy and chronic microvascular  ischemic change as before. Electronically Signed   By: Donzetta Kohut M.D.   On: 05/11/2021 14:25   CT Cervical Spine Wo Contrast  Result Date: 05/11/2021 CLINICAL DATA:  Neck trauma in a 87 year old male. EXAM: CT CERVICAL SPINE WITHOUT CONTRAST TECHNIQUE: Multidetector CT imaging of the cervical spine was performed without intravenous contrast. Multiplanar CT image reconstructions were also generated. COMPARISON:  Comparison made with head exam of the same date and previous C-spine evaluation from August 02, 2020. FINDINGS: Alignment: Straightening of normal cervical lordotic curvature is similar to the prior study. Minimal anterolisthesis of C6 on C7 in the setting of degenerative changes is a finding that is unchanged. Skull base and vertebrae: No acute fracture. No primary bone lesion or focal pathologic process.  Soft tissues and spinal canal: No prevertebral fluid or swelling. No visible canal hematoma. Disc levels: Multilevel degenerative changes similar to the prior study with marked atlantoaxial and atlantooccipital degenerative change as well as disc space narrowing, uncovertebral spurring and facet arthropathy. Upper chest: Negative. Other: None IMPRESSION: No acute fracture or dislocation in the cervical spine. Multilevel degenerative changes similar to the prior study. Electronically Signed   By: Donzetta Kohut M.D.   On: 05/11/2021 14:14   DG Humerus Right  Result Date: 05/11/2021 CLINICAL DATA:  Fall, pain EXAM: RIGHT HUMERUS - 2+ VIEW COMPARISON:  None. FINDINGS: No acute bony abnormality. Specifically, no fracture, subluxation, or dislocation. Degenerative changes in the shoulder. Soft tissues are intact. IMPRESSION: No acute bony abnormality. Electronically Signed   By: Charlett Nose M.D.   On: 05/11/2021 20:16   DG Hand Complete Right  Result Date: 05/11/2021 CLINICAL DATA:  Fall, pain EXAM: RIGHT HAND - COMPLETE 3+ VIEW COMPARISON:  None. FINDINGS: No acute bony abnormality. Specifically, no fracture, subluxation, or dislocation. Mild degenerative changes in the IP joints and wrist. IMPRESSION: No acute bony abnormality. Electronically Signed   By: Charlett Nose M.D.   On: 05/11/2021 20:15    Plain film imaging of the right upper extremity negative for acute fracture.  CT head and cervical spine negative for acute finding.  ____________________________________________   PROCEDURES  Procedure(s) performed: None  Procedures  Critical Care performed: No  ____________________________________________   INITIAL IMPRESSION / ASSESSMENT AND PLAN / ED COURSE  Pertinent labs & imaging results that were available during my care of the patient were reviewed by me and considered in my medical decision making (see chart for details).   Patient presents for change in mental status, seemingly  having a change over the last 1 week per the wife with increased confusion.  Also now had a fall today.  CT imaging of the head and neck is reassuring, but concern for possible bony injury specially around the right elbow region or right upper extremity.  Imaging pending.  Clinical Course as of 05/11/21 2357  Mon May 11, 2021  1613 DG Shoulder Right [CT]    Clinical Course User Index [CT] Kem Boroughs B, FNP    Also concern for confusion increasing over the last week.  He is alert but wife reports normally he is ambulatory and much more alert and less confused.  Also additionally seems to exhibit poor use of the right lower extremity does not appear to have any evidence of trauma but he seems to not want to move and does not follow commands involving right lower extremity.  His CT of the head is reassuring but given this it seems he may potentially have a slight focal  deficit involving the right side potentially right lower extremity difficult to fully gauge of weakness in the right arm due to pain.  ----------------------------------------- 11:55 PM on 05/11/2021 ----------------------------------------- Reassessment patient resting in wheelchair.  Continues to seem like he is slightly weak over the right leg and still reports discomfort along the base of the proximal right forearm but no obvious evidence of fracture on imaging.  Suspect likely contusion, but given his dementia is also somewhat hard to decipher if he does actually have right-sided leg weakness though he is able to lift it move it back and forth to the wheelchair at this time does not have any pain and it through good range of motion of the hip and all joints.  He does have a small rash that seems to be scarring or mounding lesion over the anterior right tibia, but wife reports actually have a follow-up visit with dermatology on Tuesday for it as they are concerned it could be a skin cancer for which she is already had 5 or 6  similar lesions.  Is appropriate follow-up with dermatology seems appropriate   Ongoing care signed to Dr. Don Perking.  Given the patient's report of confusion in the setting of dementia, await urinalysis, also pending MRI of the brain to evaluate and exclude possible stroke.  Chest x-ray also ordered to evaluate and assure no evidence of rib fracture after his fall or acute chest disease though my pretest probability is low   ____________________________________________   FINAL CLINICAL IMPRESSION(S) / ED DIAGNOSES  Final diagnoses:  Alzheimer's dementia, unspecified dementia severity, unspecified timing of dementia onset, unspecified whether behavioral, psychotic, or mood disturbance or anxiety (HCC)  Arm contusion, right, initial encounter  Fall, initial encounter        Note:  This document was prepared using Conservation officer, historic buildings and may include unintentional dictation errors       Sharyn Creamer, MD 05/11/21 2358

## 2021-05-12 ENCOUNTER — Inpatient Hospital Stay: Payer: Medicare Other

## 2021-05-12 ENCOUNTER — Encounter: Payer: Self-pay | Admitting: Internal Medicine

## 2021-05-12 ENCOUNTER — Inpatient Hospital Stay
Admission: EM | Admit: 2021-05-12 | Discharge: 2021-05-15 | DRG: 281 | Disposition: A | Discharge status: Skilled Nursing Facility | Payer: Medicare Other | Source: Skilled Nursing Facility | Attending: Internal Medicine | Admitting: Internal Medicine

## 2021-05-12 DIAGNOSIS — F0390 Unspecified dementia without behavioral disturbance: Secondary | ICD-10-CM

## 2021-05-12 DIAGNOSIS — R52 Pain, unspecified: Secondary | ICD-10-CM

## 2021-05-12 DIAGNOSIS — S40021A Contusion of right upper arm, initial encounter: Secondary | ICD-10-CM

## 2021-05-12 DIAGNOSIS — F02C11 Dementia in other diseases classified elsewhere, severe, with agitation: Secondary | ICD-10-CM | POA: Diagnosis present

## 2021-05-12 DIAGNOSIS — F02C3 Dementia in other diseases classified elsewhere, severe, with mood disturbance: Secondary | ICD-10-CM | POA: Diagnosis present

## 2021-05-12 DIAGNOSIS — W19XXXA Unspecified fall, initial encounter: Secondary | ICD-10-CM | POA: Diagnosis present

## 2021-05-12 DIAGNOSIS — W19XXXD Unspecified fall, subsequent encounter: Secondary | ICD-10-CM

## 2021-05-12 DIAGNOSIS — F32A Depression, unspecified: Secondary | ICD-10-CM | POA: Diagnosis present

## 2021-05-12 DIAGNOSIS — I4891 Unspecified atrial fibrillation: Secondary | ICD-10-CM

## 2021-05-12 DIAGNOSIS — R531 Weakness: Secondary | ICD-10-CM | POA: Diagnosis present

## 2021-05-12 DIAGNOSIS — Z79899 Other long term (current) drug therapy: Secondary | ICD-10-CM | POA: Diagnosis not present

## 2021-05-12 DIAGNOSIS — G309 Alzheimer's disease, unspecified: Secondary | ICD-10-CM | POA: Diagnosis present

## 2021-05-12 DIAGNOSIS — Z20822 Contact with and (suspected) exposure to covid-19: Secondary | ICD-10-CM | POA: Diagnosis present

## 2021-05-12 DIAGNOSIS — Z87891 Personal history of nicotine dependence: Secondary | ICD-10-CM | POA: Diagnosis not present

## 2021-05-12 DIAGNOSIS — E785 Hyperlipidemia, unspecified: Secondary | ICD-10-CM | POA: Diagnosis present

## 2021-05-12 DIAGNOSIS — F0280 Dementia in other diseases classified elsewhere without behavioral disturbance: Principal | ICD-10-CM

## 2021-05-12 DIAGNOSIS — F02C4 Dementia in other diseases classified elsewhere, severe, with anxiety: Secondary | ICD-10-CM | POA: Diagnosis present

## 2021-05-12 DIAGNOSIS — I214 Non-ST elevation (NSTEMI) myocardial infarction: Secondary | ICD-10-CM | POA: Diagnosis present

## 2021-05-12 DIAGNOSIS — Z66 Do not resuscitate: Secondary | ICD-10-CM | POA: Diagnosis present

## 2021-05-12 DIAGNOSIS — M79601 Pain in right arm: Secondary | ICD-10-CM | POA: Diagnosis present

## 2021-05-12 HISTORY — DX: Unspecified dementia, unspecified severity, without behavioral disturbance, psychotic disturbance, mood disturbance, and anxiety: F03.90

## 2021-05-12 LAB — URINALYSIS, COMPLETE (UACMP) WITH MICROSCOPIC
Bacteria, UA: NONE SEEN
Bilirubin Urine: NEGATIVE
Glucose, UA: >=500 mg/dL — AB
Ketones, ur: 20 mg/dL — AB
Leukocytes,Ua: NEGATIVE
Nitrite: NEGATIVE
Protein, ur: 30 mg/dL — AB
Specific Gravity, Urine: 1.015 (ref 1.005–1.030)
Squamous Epithelial / HPF: NONE SEEN (ref 0–5)
pH: 5 (ref 5.0–8.0)

## 2021-05-12 LAB — TROPONIN I (HIGH SENSITIVITY)
Troponin I (High Sensitivity): 78 ng/L — ABNORMAL HIGH
Troponin I (High Sensitivity): 89 ng/L — ABNORMAL HIGH (ref <18)

## 2021-05-12 LAB — CK: Total CK: 396 U/L (ref 49–397)

## 2021-05-12 LAB — HEPARIN LEVEL (UNFRACTIONATED): Heparin Unfractionated: 0.2 IU/mL — ABNORMAL LOW (ref 0.30–0.70)

## 2021-05-12 LAB — PROTIME-INR
INR: 1.1 (ref 0.8–1.2)
Prothrombin Time: 14.6 seconds (ref 11.4–15.2)

## 2021-05-12 LAB — TSH: TSH: 0.683 u[IU]/mL (ref 0.350–4.500)

## 2021-05-12 LAB — APTT: aPTT: 33 s (ref 24–36)

## 2021-05-12 MED ORDER — MIRTAZAPINE 15 MG PO TABS
15.0000 mg | ORAL_TABLET | Freq: Every day | ORAL | Status: DC
  Administered 2021-05-12 – 2021-05-14 (×3): 15 mg via ORAL
  Filled 2021-05-12 (×3): qty 1

## 2021-05-12 MED ORDER — HEPARIN BOLUS VIA INFUSION
4000.0000 [IU] | Freq: Once | INTRAVENOUS | Status: AC
  Administered 2021-05-12: 4000 [IU] via INTRAVENOUS
  Filled 2021-05-12: qty 4000

## 2021-05-12 MED ORDER — LORAZEPAM 0.5 MG PO TABS
0.5000 mg | ORAL_TABLET | Freq: Three times a day (TID) | ORAL | Status: DC | PRN
  Administered 2021-05-12: 0.5 mg via ORAL
  Filled 2021-05-12: qty 1

## 2021-05-12 MED ORDER — RISPERIDONE 0.5 MG PO TABS
0.5000 mg | ORAL_TABLET | Freq: Every day | ORAL | Status: DC
  Administered 2021-05-12 – 2021-05-14 (×3): 0.5 mg via ORAL
  Filled 2021-05-12 (×4): qty 1

## 2021-05-12 MED ORDER — HEPARIN BOLUS VIA INFUSION
1300.0000 [IU] | Freq: Once | INTRAVENOUS | Status: AC
  Administered 2021-05-12: 1300 [IU] via INTRAVENOUS
  Filled 2021-05-12: qty 1300

## 2021-05-12 MED ORDER — SIMVASTATIN 20 MG PO TABS
20.0000 mg | ORAL_TABLET | Freq: Every day | ORAL | Status: DC
  Administered 2021-05-12 – 2021-05-14 (×3): 20 mg via ORAL
  Filled 2021-05-12: qty 1
  Filled 2021-05-12: qty 2
  Filled 2021-05-12: qty 1

## 2021-05-12 MED ORDER — METOPROLOL TARTRATE 25 MG PO TABS
25.0000 mg | ORAL_TABLET | Freq: Two times a day (BID) | ORAL | Status: DC
  Administered 2021-05-12 – 2021-05-15 (×7): 25 mg via ORAL
  Filled 2021-05-12 (×7): qty 1

## 2021-05-12 MED ORDER — ONDANSETRON HCL 4 MG/2ML IJ SOLN: 4.0000 mg | Freq: Four times a day (QID) | INTRAMUSCULAR | Status: DC | PRN

## 2021-05-12 MED ORDER — TRAZODONE HCL 50 MG PO TABS
50.0000 mg | ORAL_TABLET | Freq: Every day | ORAL | Status: DC
  Administered 2021-05-12 – 2021-05-14 (×3): 50 mg via ORAL
  Filled 2021-05-12 (×3): qty 1

## 2021-05-12 MED ORDER — AMMONIUM LACTATE 12 % EX LOTN
TOPICAL_LOTION | Freq: Every day | CUTANEOUS | Status: DC
  Filled 2021-05-12 (×2): qty 400

## 2021-05-12 MED ORDER — PANTOPRAZOLE SODIUM 40 MG PO TBEC
40.0000 mg | DELAYED_RELEASE_TABLET | Freq: Every day | ORAL | Status: DC
  Administered 2021-05-12 – 2021-05-15 (×4): 40 mg via ORAL
  Filled 2021-05-12 (×4): qty 1

## 2021-05-12 MED ORDER — POTASSIUM CHLORIDE CRYS ER 20 MEQ PO TBCR
40.0000 meq | EXTENDED_RELEASE_TABLET | Freq: Once | ORAL | Status: AC
  Administered 2021-05-12: 40 meq via ORAL
  Filled 2021-05-12: qty 2

## 2021-05-12 MED ORDER — ACETAMINOPHEN 325 MG PO TABS
650.0000 mg | ORAL_TABLET | ORAL | Status: DC | PRN
  Administered 2021-05-13: 650 mg via ORAL
  Filled 2021-05-12: qty 2

## 2021-05-12 MED ORDER — HEPARIN (PORCINE) 25000 UT/250ML-% IV SOLN
1500.0000 [IU]/h | INTRAVENOUS | Status: DC
  Administered 2021-05-12: 1100 [IU]/h via INTRAVENOUS
  Administered 2021-05-13: 1500 [IU]/h via INTRAVENOUS
  Filled 2021-05-12 (×3): qty 250

## 2021-05-12 NOTE — ED Notes (Signed)
Informed lab staff face to face that had called lab earlier to inform this pt has heparin level and other labs due. Heparin level was supposed to be drawn at 1230. They said they will look. IV does not draw back and pt has dementia and is uncooperative although at this time pt is resting in bed, eating lunch with help from wife.

## 2021-05-12 NOTE — Progress Notes (Signed)
ANTICOAGULATION CONSULT NOTE  Pharmacy Consult for heparin infusion Indication: ACS/STEMI  No Known Allergies  Patient Measurements: Height: 5\' 9"  (175.3 cm) Weight: 84.4 kg (186 lb) IBW/kg (Calculated) : 70.7 Heparin Dosing Weight: 84.4 kg  Vital Signs: BP: 156/95 (10/17 2216) Pulse Rate: 95 (10/17 2216)  Labs: Recent Labs    05/11/21 1613 05/12/21 0150  HGB 14.5  --   HCT 42.4  --   PLT 243  --   CREATININE 0.79  --   TROPONINIHS 21* 78*    Estimated Creatinine Clearance: 66.3 mL/min (by C-G formula based on SCr of 0.79 mg/dL).   Medical History: History reviewed. No pertinent past medical history.  Assessment: Pt is 87 yo male w/ dementia presenting to ED via EMS, after fall yesterday AM found with slight elevated Troponin I, trending up 21 >> 78.  Goal of Therapy:  Heparin level 0.3-0.7 units/ml Monitor platelets by anticoagulation protocol: Yes   Plan:  Bolus 4000 units x 1 Start heparin infusion at 1100 units/hr Check HL 8 hrs after start of infusion Daily CBC while on heparin.  88, PharmD, [facility] 05/12/2021 3:34 AM

## 2021-05-12 NOTE — ED Notes (Signed)
Pt urinated on self and wet bed and floor. Will clean pt up with help from another staff member.

## 2021-05-12 NOTE — Consult Note (Addendum)
[facility] Cardiology Consultation Note  Patient ID: Stanley Sutton, MRN: [id number], DOB/AGE: 02/01/1932 87 y.o. Admit date: 05/12/2021   Date of Consult: 05/12/2021 Primary Physician: [facility] Primary Cardiologist: none  Chief Complaint:  Chief Complaint  Patient presents with   Fall   Fatigue   Reason for Consult: new onset atrial fibrillation  HPI: 87 y.o. male with a past medical history significant for dementia, hyperlipidemia who presented to the ED by EMS for evaluation of new onset right-sided weakness associated with fall.  Patient's wife stated that staff at the assisted living facility had found the patient on the floor with complaints of right-sided weakness, unable to use his right side or bear weight on his right leg.  CT of the head showed no acute intracranial pathology, brain MRI showed limited exam as the patient was unable to complete the study, on diffusion-weighted imaging there was no evidence of acute infarct.  CXR showed no evidence of acute cardiopulmonary disease.  On presentation to the ED patient's EKG showed the patient was in atrial fibrillation at a controlled rate of 91 bpm.  Patient does not currently have any past medical history of atrial fibrillation and is not on any blood thinners.  Initial troponin of 21 trending upward to 78.  Past Medical History:  Diagnosis Date   Dementia [facility])       Surgical History: History reviewed. No pertinent surgical history.   Home Meds: Prior to Admission medications   Medication Sig Start Date End Date Taking? Authorizing Provider  ammonium lactate (LAC-HYDRIN) 12 % lotion APPLY TO BOTH LEGS BELOW THE KNEES EVERY DAY   Yes [provider]  Cholecalciferol (VITAMIN D3) 1.25 MG (50000 UT) TABS Take 50,000 Units by mouth once a week.   Yes [provider]  LORazepam (ATIVAN) 0.5 MG tablet Take 0.5 mg by mouth 3 (three) times daily as needed for agitation. 08/21/20  Yes [provider]  mirtazapine (REMERON) 15 MG tablet Take 15 mg by mouth at bedtime.   Yes [provider]  omeprazole (PRILOSEC) 20 MG capsule Take 20 mg by mouth daily. 03/26/19  Yes [provider]  risperiDONE (RISPERDAL) 0.5 MG tablet Take 0.5 mg by mouth at bedtime.   Yes [provider]  simvastatin (ZOCOR) 20 MG tablet Take 20 mg by mouth daily.   Yes [provider]  traZODone (DESYREL) 50 MG tablet Take 50 mg by mouth at bedtime.   Yes [provider]  vitamin B-12 (CYANOCOBALAMIN) 1000 MCG tablet Take 1,000 mcg by mouth daily.   Yes [provider]    Inpatient Medications:   ammonium lactate   Topical Daily   fentaNYL (SUBLIMAZE) injection  12.5 mcg Intravenous Once   ibuprofen  600 mg Oral STAT   metoprolol tartrate  25 mg Oral BID   mirtazapine  15 mg Oral QHS   pantoprazole  40 mg Oral Daily    heparin 1,100 Units/hr (05/12/21 0437)    Allergies: No Known Allergies  Social History   Socioeconomic History   Marital status: Married    Spouse name: Not on file   Number of children: Not on file   Years of education: Not on file   Highest education level: Not on file  Occupational History   Not on file  Tobacco Use   Smoking status: Never   Smokeless tobacco: Former  Substance and Sexual Activity   Alcohol use: Not on file   Drug use:  Not on file   Sexual activity: Not on file  Other Topics Concern   Not on file  Social History Narrative   Not on file   Social Determinants of Health   Financial Resource Strain: Not on file  Food Insecurity: Not on file  Transportation Needs: Not on file  Physical Activity: Not on file  Stress: Not on file  Social Connections: Not on file  Intimate Partner Violence: Not on file     Family History  Problem Relation Age of Onset   Dementia Mother      Review of Systems Positive for right-sided weakness Negative for: General:  chills, fever, night sweats or weight  changes.  Cardiovascular: PND orthopnea syncope dizziness  Dermatological skin lesions rashes Respiratory: Cough congestion Urologic: Frequent urination urination at night and hematuria Abdominal: negative for nausea, vomiting, diarrhea, bright red blood per rectum, melena, or hematemesis Neurologic: negative for visual changes, and/or hearing changes  All other systems reviewed and are otherwise negative except as noted above.  Labs: No results for input(s): CKTOTAL, CKMB, TROPONINI in the last 72 hours. Lab Results  Component Value Date   WBC 11.2 (H) 05/11/2021   HGB 14.5 05/11/2021   HCT 42.4 05/11/2021   MCV 94.4 05/11/2021   PLT 243 05/11/2021    Recent Labs  Lab 05/11/21 1613  NA 136  K 3.4*  CL 98  CO2 25  BUN 14  CREATININE 0.79  CALCIUM 9.9  GLUCOSE 165*   No results found for: CHOL, HDL, LDLCALC, TRIG No results found for: DDIMER  Radiology/Studies:  DG Shoulder Right  Result Date: 05/11/2021 CLINICAL DATA:  Fall, pain EXAM: RIGHT SHOULDER - 2+ VIEW COMPARISON:  None. FINDINGS: Degenerative changes in the right shoulder with joint space narrowing and spurring. Prior resection of the distal clavicle. No acute bony abnormality. Specifically, no fracture, subluxation, or dislocation. IMPRESSION: Advanced degenerative changes.  No acute bony abnormality. Electronically Signed   By: Charlett Nose M.D.   On: 05/11/2021 20:18   DG Forearm Right  Result Date: 05/11/2021 CLINICAL DATA:  Fall, pain EXAM: RIGHT FOREARM - 2 VIEW COMPARISON:  None. FINDINGS: Advanced degenerative changes at the right elbow. Extensive spurring. Intra-articular loose bodies present. No visible fracture. No subluxation or dislocation. IMPRESSION: No acute bony abnormality. Electronically Signed   By: Charlett Nose M.D.   On: 05/11/2021 20:17   DG Wrist Complete Right  Result Date: 05/11/2021 CLINICAL DATA:  Fall, pain EXAM: RIGHT WRIST - COMPLETE 3+ VIEW COMPARISON:  None. FINDINGS: Mild  degenerative changes in the wrist. No acute bony abnormality. Specifically, no fracture, subluxation, or dislocation. IMPRESSION: No acute bony abnormality. Electronically Signed   By: Charlett Nose M.D.   On: 05/11/2021 20:17   DG Knee 1-2 Views Right  Result Date: 05/12/2021 CLINICAL DATA:  87 year old male status post fall yesterday with pain. EXAM: RIGHT KNEE - 1-2 VIEW COMPARISON:  None. FINDINGS: Total knee arthroplasty. Hardware appears intact and normally aligned. Postoperative changes to the patella. No definite joint effusion on cross-table lateral view. Calcified peripheral vascular disease. No acute osseous abnormality identified. IMPRESSION: Right knee total arthroplasty. No acute fracture or dislocation identified. Electronically Signed   By: Odessa Fleming M.D.   On: 05/12/2021 10:02   CT HEAD WO CONTRAST  Result Date: 05/11/2021 CLINICAL DATA:  Status post fall.  87 year old male. EXAM: CT HEAD WITHOUT CONTRAST TECHNIQUE: Contiguous axial images were obtained from the base of the skull through the vertex without intravenous contrast.  COMPARISON:  August 02, 2020. FINDINGS: Brain: No evidence of acute infarction, hemorrhage, hydrocephalus, extra-axial collection or mass lesion/mass effect. Signs of atrophy and chronic microvascular ischemic change as before. Vascular: No hyperdense vessel or unexpected calcification. Skull: Normal. Negative for fracture or focal lesion. Sinuses/Orbits: Visualized paranasal sinuses and orbits are unremarkable. Other: None IMPRESSION: No acute intracranial pathology. Signs of atrophy and chronic microvascular ischemic change as before. Electronically Signed   By: Donzetta Kohut M.D.   On: 05/11/2021 14:25   CT Cervical Spine Wo Contrast  Result Date: 05/11/2021 CLINICAL DATA:  Neck trauma in a 87 year old male. EXAM: CT CERVICAL SPINE WITHOUT CONTRAST TECHNIQUE: Multidetector CT imaging of the cervical spine was performed without intravenous contrast. Multiplanar  CT image reconstructions were also generated. COMPARISON:  Comparison made with head exam of the same date and previous C-spine evaluation from August 02, 2020. FINDINGS: Alignment: Straightening of normal cervical lordotic curvature is similar to the prior study. Minimal anterolisthesis of C6 on C7 in the setting of degenerative changes is a finding that is unchanged. Skull base and vertebrae: No acute fracture. No primary bone lesion or focal pathologic process. Soft tissues and spinal canal: No prevertebral fluid or swelling. No visible canal hematoma. Disc levels: Multilevel degenerative changes similar to the prior study with marked atlantoaxial and atlantooccipital degenerative change as well as disc space narrowing, uncovertebral spurring and facet arthropathy. Upper chest: Negative. Other: None IMPRESSION: No acute fracture or dislocation in the cervical spine. Multilevel degenerative changes similar to the prior study. Electronically Signed   By: Donzetta Kohut M.D.   On: 05/11/2021 14:14   MR BRAIN WO CONTRAST  Result Date: 05/11/2021 CLINICAL DATA:  Stroke suspected, right leg weakness EXAM: MRI HEAD WITHOUT CONTRAST TECHNIQUE: Multiplanar, multiecho pulse sequences of the brain and surrounding structures were obtained without intravenous contrast. COMPARISON:  No prior brain MRI, correlation is made with 05/11/2021 CT head FINDINGS: Evaluation is somewhat limited as the patient not tolerate the exam, which is incomplete. Only diffusion-weighted imaging and a sagittal T1 sequence were obtained, the sagittal T1 being severely limited by motion artifact. Within this limitation, there is no restricted diffusion to suggest acute infarct. Cerebral volume loss, with commensurate size the ventricles and sulci. IMPRESSION: Limited exam, as the patient was unable to complete the study. On diffusion-weighted imaging, there is no evidence of acute infarct. Electronically Signed   By: Wiliam Ke M.D.   On:  05/11/2021 23:58   DG Chest Port 1 View  Result Date: 05/12/2021 CLINICAL DATA:  Fall, lethargy EXAM: PORTABLE CHEST 1 VIEW COMPARISON:  08/02/2020 FINDINGS: Lungs are clear.  No pleural effusion or pneumothorax. Heart is top-normal in size. IMPRESSION: No evidence of acute cardiopulmonary disease. Electronically Signed   By: Charline Bills M.D.   On: 05/12/2021 00:24   DG Humerus Right  Result Date: 05/11/2021 CLINICAL DATA:  Fall, pain EXAM: RIGHT HUMERUS - 2+ VIEW COMPARISON:  None. FINDINGS: No acute bony abnormality. Specifically, no fracture, subluxation, or dislocation. Degenerative changes in the shoulder. Soft tissues are intact. IMPRESSION: No acute bony abnormality. Electronically Signed   By: Charlett Nose M.D.   On: 05/11/2021 20:16   DG Hand Complete Right  Result Date: 05/11/2021 CLINICAL DATA:  Fall, pain EXAM: RIGHT HAND - COMPLETE 3+ VIEW COMPARISON:  None. FINDINGS: No acute bony abnormality. Specifically, no fracture, subluxation, or dislocation. Mild degenerative changes in the IP joints and wrist. IMPRESSION: No acute bony abnormality. Electronically Signed   By: Caryn Bee  Dover M.D.   On: 05/11/2021 20:15   DG HIP UNILAT WITH PELVIS 2-3 VIEWS RIGHT  Result Date: 05/12/2021 CLINICAL DATA:  Larey Seat yesterday.  Hip pain. EXAM: DG HIP (WITH OR WITHOUT PELVIS) 2-3V RIGHT COMPARISON:  None. FINDINGS: No evidence of pelvic or hip fracture. Lower lumbar degenerative changes incidentally noted. IMPRESSION: No acute or traumatic finding. Electronically Signed   By: Paulina Fusi M.D.   On: 05/12/2021 10:01    EKG: Atrial fibrillation at 91 bpm  Weights: Filed Weights   05/11/21 1301  Weight: 84.4 kg     Physical Exam: Blood pressure 136/75, pulse 81, temperature 98.9 F (37.2 C), temperature source Oral, resp. rate 18, height 5\' 9"  (1.753 m), weight 84.4 kg, SpO2 99 %. Body mass index is 27.47 kg/m. General: Well developed, well nourished, in no acute distress. Head eyes  ears nose throat: Normocephalic, atraumatic, sclera non-icteric, no xanthomas, nares are without discharge. No apparent thyromegaly and/or mass  Lungs: Normal respiratory effort.  no wheezes, no rales, no rhonchi.  Heart: Irregular rate and rhythm with normal S1 S2. no murmur gallop, no rub, PMI is normal size and placement, carotid upstroke normal without bruit, jugular venous pressure is normal Abdomen: Soft, non-tender, non-distended with normoactive bowel sounds. No hepatomegaly. No rebound/guarding. No obvious abdominal masses. Abdominal aorta is normal size without bruit Extremities: No edema. no cyanosis, no clubbing, no ulcers  Peripheral : 2+ bilateral upper extremity pulses, 2+ bilateral femoral pulses, 2+ bilateral dorsal pedal pulse Neuro: Alert and oriented. No facial asymmetry. No focal deficit. Moves all extremities spontaneously. Musculoskeletal: Normal muscle tone without kyphosis Psych:  Responds to questions appropriately with a normal affect.    Assessment: 87 year old male with a past medical history of dementia, hyperlipidemia who presented with mildly elevated troponin and new onset atrial fibrillation, not anticoagulated.  Currently with good rate control.  Plan: -Elevated troponin likely secondary to demand ischemia from current illness.  May continue heparin for anticoagulation with atrial fibrillation and transition to oral Eliquis 5 mg twice daily -Patient currently has adequate rate control of atrial fibrillation with metoprolol 25 mg twice daily, continue without changes -Continue to trend troponin -We will await the results of the echocardiogram to assess patient's heart structure and function.  Signed, 88 PA-C [facility] Cardiology 05/12/2021, 1:57 PM  The patient has been interviewed and examined. I agree with assessment and plan above. 05/14/2021 MD [facility]

## 2021-05-12 NOTE — ED Provider Notes (Signed)
MRI and UA negative for stroke or UTI.  Repeat troponin is trending up from 21 to 78. Patient with advanced dementia and was complaining of right arm pain earlier today.  Possible anginal equivalent.  We will start patient on heparin and admit to the hospitalist service.  Review of telemetry shows patient is in normal sinus rhythm.  We will discussed with the hospitalist for admission.    CRITICAL CARE Performed by: Nita Sickle  ?  Total critical care time: 30 min  Critical care time was exclusive of separately billable procedures and treating other patients.  Critical care was necessary to treat or prevent imminent or life-threatening deterioration.  Critical care was time spent personally by me on the following activities: development of treatment plan with patient and/or surrogate as well as nursing, discussions with consultants, evaluation of patient's response to treatment, examination of patient, obtaining history from patient or surrogate, ordering and performing treatments and interventions, ordering and review of laboratory studies, ordering and review of radiographic studies, pulse oximetry and re-evaluation of patient's condition.    I have personally reviewed the images performed during this visit and I agree with the Radiologist's read.   Interpretation by Radiologist:  DG Shoulder Right  Result Date: 05/11/2021 CLINICAL DATA:  Fall, pain EXAM: RIGHT SHOULDER - 2+ VIEW COMPARISON:  None. FINDINGS: Degenerative changes in the right shoulder with joint space narrowing and spurring. Prior resection of the distal clavicle. No acute bony abnormality. Specifically, no fracture, subluxation, or dislocation. IMPRESSION: Advanced degenerative changes.  No acute bony abnormality. Electronically Signed   By: Charlett Nose M.D.   On: 05/11/2021 20:18   DG Forearm Right  Result Date: 05/11/2021 CLINICAL DATA:  Fall, pain EXAM: RIGHT FOREARM - 2 VIEW COMPARISON:  None. FINDINGS:  Advanced degenerative changes at the right elbow. Extensive spurring. Intra-articular loose bodies present. No visible fracture. No subluxation or dislocation. IMPRESSION: No acute bony abnormality. Electronically Signed   By: Charlett Nose M.D.   On: 05/11/2021 20:17   DG Wrist Complete Right  Result Date: 05/11/2021 CLINICAL DATA:  Fall, pain EXAM: RIGHT WRIST - COMPLETE 3+ VIEW COMPARISON:  None. FINDINGS: Mild degenerative changes in the wrist. No acute bony abnormality. Specifically, no fracture, subluxation, or dislocation. IMPRESSION: No acute bony abnormality. Electronically Signed   By: Charlett Nose M.D.   On: 05/11/2021 20:17   CT HEAD WO CONTRAST  Result Date: 05/11/2021 CLINICAL DATA:  Status post fall.  87 year old male. EXAM: CT HEAD WITHOUT CONTRAST TECHNIQUE: Contiguous axial images were obtained from the base of the skull through the vertex without intravenous contrast. COMPARISON:  August 02, 2020. FINDINGS: Brain: No evidence of acute infarction, hemorrhage, hydrocephalus, extra-axial collection or mass lesion/mass effect. Signs of atrophy and chronic microvascular ischemic change as before. Vascular: No hyperdense vessel or unexpected calcification. Skull: Normal. Negative for fracture or focal lesion. Sinuses/Orbits: Visualized paranasal sinuses and orbits are unremarkable. Other: None IMPRESSION: No acute intracranial pathology. Signs of atrophy and chronic microvascular ischemic change as before. Electronically Signed   By: Donzetta Kohut M.D.   On: 05/11/2021 14:25   CT Cervical Spine Wo Contrast  Result Date: 05/11/2021 CLINICAL DATA:  Neck trauma in a 87 year old male. EXAM: CT CERVICAL SPINE WITHOUT CONTRAST TECHNIQUE: Multidetector CT imaging of the cervical spine was performed without intravenous contrast. Multiplanar CT image reconstructions were also generated. COMPARISON:  Comparison made with head exam of the same date and previous C-spine evaluation from August 02, 2020. FINDINGS: Alignment: Straightening  of normal cervical lordotic curvature is similar to the prior study. Minimal anterolisthesis of C6 on C7 in the setting of degenerative changes is a finding that is unchanged. Skull base and vertebrae: No acute fracture. No primary bone lesion or focal pathologic process. Soft tissues and spinal canal: No prevertebral fluid or swelling. No visible canal hematoma. Disc levels: Multilevel degenerative changes similar to the prior study with marked atlantoaxial and atlantooccipital degenerative change as well as disc space narrowing, uncovertebral spurring and facet arthropathy. Upper chest: Negative. Other: None IMPRESSION: No acute fracture or dislocation in the cervical spine. Multilevel degenerative changes similar to the prior study. Electronically Signed   By: Donzetta Kohut M.D.   On: 05/11/2021 14:14   MR BRAIN WO CONTRAST  Result Date: 05/11/2021 CLINICAL DATA:  Stroke suspected, right leg weakness EXAM: MRI HEAD WITHOUT CONTRAST TECHNIQUE: Multiplanar, multiecho pulse sequences of the brain and surrounding structures were obtained without intravenous contrast. COMPARISON:  No prior brain MRI, correlation is made with 05/11/2021 CT head FINDINGS: Evaluation is somewhat limited as the patient not tolerate the exam, which is incomplete. Only diffusion-weighted imaging and a sagittal T1 sequence were obtained, the sagittal T1 being severely limited by motion artifact. Within this limitation, there is no restricted diffusion to suggest acute infarct. Cerebral volume loss, with commensurate size the ventricles and sulci. IMPRESSION: Limited exam, as the patient was unable to complete the study. On diffusion-weighted imaging, there is no evidence of acute infarct. Electronically Signed   By: Wiliam Ke M.D.   On: 05/11/2021 23:58   DG Chest Port 1 View  Result Date: 05/12/2021 CLINICAL DATA:  Fall, lethargy EXAM: PORTABLE CHEST 1 VIEW COMPARISON:  08/02/2020  FINDINGS: Lungs are clear.  No pleural effusion or pneumothorax. Heart is top-normal in size. IMPRESSION: No evidence of acute cardiopulmonary disease. Electronically Signed   By: Charline Bills M.D.   On: 05/12/2021 00:24   DG Humerus Right  Result Date: 05/11/2021 CLINICAL DATA:  Fall, pain EXAM: RIGHT HUMERUS - 2+ VIEW COMPARISON:  None. FINDINGS: No acute bony abnormality. Specifically, no fracture, subluxation, or dislocation. Degenerative changes in the shoulder. Soft tissues are intact. IMPRESSION: No acute bony abnormality. Electronically Signed   By: Charlett Nose M.D.   On: 05/11/2021 20:16   DG Hand Complete Right  Result Date: 05/11/2021 CLINICAL DATA:  Fall, pain EXAM: RIGHT HAND - COMPLETE 3+ VIEW COMPARISON:  None. FINDINGS: No acute bony abnormality. Specifically, no fracture, subluxation, or dislocation. Mild degenerative changes in the IP joints and wrist. IMPRESSION: No acute bony abnormality. Electronically Signed   By: Charlett Nose M.D.   On: 05/11/2021 20:15      Nita Sickle, MD 05/12/21 [phone]

## 2021-05-12 NOTE — Progress Notes (Signed)
ANTICOAGULATION CONSULT NOTE  Pharmacy Consult for heparin infusion Indication: afib   No Known Allergies  Patient Measurements: Height: 5\' 9"  (175.3 cm) Weight: 84.4 kg (186 lb) IBW/kg (Calculated) : 70.7 Heparin Dosing Weight: 84.4 kg  Vital Signs: BP: 136/75 (10/18 1156) Pulse Rate: 81 (10/18 1156)  Labs: Recent Labs    05/11/21 1613 05/12/21 0150 05/12/21 0400 05/12/21 1345  HGB 14.5  --   --   --   HCT 42.4  --   --   --   PLT 243  --   --   --   APTT  --   --  33  --   LABPROT  --   --  14.6  --   INR  --   --  1.1  --   HEPARINUNFRC  --   --   --  0.20*  CREATININE 0.79  --   --   --   CKTOTAL  --   --   --  396  TROPONINIHS 21* 78*  --  89*     Estimated Creatinine Clearance: 66.3 mL/min (by C-G formula based on SCr of 0.79 mg/dL).   Medical History: Past Medical History:  Diagnosis Date   Dementia [facility])     Assessment: 87yo male w/ dementia presenting to ED via EMS, after fall yesterday AM found with slightly elevated Troponin. The elevation is being attributed to demand ischemia from current illness. Pt was found to have new onset atrial fibrillation. Pharmacy was consulted for heparin.   Baseline CBC, PT/INR, and aPTT were within normal limits   Date/time HL  Interpretation/comment 10/18 1345 0.20 Subthera at 1100 units/hr  Goal of Therapy:  Heparin level 0.3-0.7 units/ml Monitor platelets by anticoagulation protocol: Yes   Plan:  Heparin subtherapeutic at 1100units/hr, will give bolus of 1300 units and increase infusion to 1250 units/hr Will recheck HL in 8h Continue to monitor daily CBC while on heparin ggt  11/18, PharmD Pharmacy Resident  05/12/2021 3:10 PM

## 2021-05-12 NOTE — ED Notes (Signed)
Pt in xray

## 2021-05-12 NOTE — ED Notes (Signed)
Water and applesauce provided. Breakfast tray at bedside.

## 2021-05-12 NOTE — ED Notes (Signed)
IV line does not draw back. Called lab to draw ordered labs along with 1230 heparin level. They will come draw all labs at 1230.

## 2021-05-12 NOTE — ED Notes (Signed)
Tried to draw labs off IV line. Line does not draw back well. Lab will draw other labs at 1230 with heparin level.

## 2021-05-12 NOTE — ED Notes (Signed)
Critical troponin to verbal given to Don Perking MD

## 2021-05-12 NOTE — ED Notes (Signed)
Wife back at bedside.

## 2021-05-12 NOTE — ED Notes (Signed)
Pt was taken to xray and heparin drip was not taken with pt. Will reconnect when pt returns. Xray informed.

## 2021-05-12 NOTE — ED Notes (Signed)
Pt back from xray. Heparin restarted.

## 2021-05-12 NOTE — H&P (Signed)
History and Physical    Stanley Sutton WUJ:[id number] DOB: 07-28-1931 DOA: 05/12/2021  PCP: [facility]   Patient coming from [facility] care unit   I have personally briefly reviewed patient's old medical records in [facility]  Chief Complaint: Status post fall  Most of the history was obtained from patient's wife at the bedside.  Patient has dementia and is unable to provide any history  HPI: Stanley Sutton is a 87 y.o. male with medical history significant for dementia, dyslipidemia, ischemic optic neuropathy who was brought into the ER by EMS for evaluation of right-sided weakness following a fall. Patient's wife states that she got a call in the early hours of the morning 1 day prior to his admission and was notified that he fell.  When she arrived at the nursing home she noticed that he was weak and unable to use his right and unable to bear weight on his right leg.  He was last seen normal by his wife about a week ago. I am unable to do review of systems on this patient. Labs show sodium 136, potassium 3.4, chloride 98, bicarb 25, glucose 165, BUN 14, creatinine 0.79, calcium 9.9, troponin 21 >> 78, white count 11.2, hemoglobin 14.5, hematocrit 42.4, MCV 94.4, RDW 13.3, platelet count 243, PT 14.6, INR 1.1 CT scan of the head without contrast shows no acute intracranial pathology.  Signs of atrophy and chronic microvascular ischemic change. Cervical spine CT shows no acute fracture or dislocation.  Multilevel degenerative changes Chest x-ray reviewed by me shows no evidence of acute cardiopulmonary disease. Patient had extensive imaging of the right hand which is negative. MRI of the brain without contrast was limited because patient was unable to complete study.  On diffusion weighted imaging there is no evidence of an acute infarct. Twelve-lead EKG reviewed by me shows atrial fibrillation   ED Course: Patient is an 87 year old male with a history  of dementia who was brought into the ER by EMS for evaluation of right-sided weakness following a fall. Patient's wife stated that he was unable to use his right hand and has been unable to bear weight on his right leg since that fall and at baseline patient ambulates with a Rollator. He had an upward trending troponin.  Twelve-lead EKG shows atrial fibrillation. Patient was started on a heparin drip in the ER and will be admitted to the hospital for further evaluation.    Review of Systems: As per HPI otherwise all other systems reviewed and negative.    Past Medical History:  Diagnosis Date   Dementia (HCC)     History reviewed. No pertinent surgical history.   reports that he has never smoked. He has quit using smokeless tobacco. No history on file for alcohol use and drug use.  No Known Allergies  Family History  Problem Relation Age of Onset   Dementia Mother       Prior to Admission medications   Medication Sig Start Date End Date Taking? Authorizing Provider  ammonium lactate (LAC-HYDRIN) 12 % lotion APPLY TO BOTH LEGS BELOW THE KNEES EVERY DAY   Yes [provider]  LORazepam (ATIVAN) 0.5 MG tablet Take 0.5 mg by mouth 3 (three) times daily as needed for agitation. 08/21/20  Yes [provider]  mirtazapine (REMERON) 15 MG tablet Take 15 mg by mouth at bedtime.   Yes [provider]  omeprazole (PRILOSEC) 20 MG capsule Take 20 mg by mouth daily. 03/26/19  Yes [provider]    Physical Exam: Vitals:   05/11/21 2216 05/12/21 0451 05/12/21 0606 05/12/21 0835  BP: (!) 156/95 (!) 145/67 (!) 162/63 133/70  Pulse: 95 (!) 101 68 78  Resp: 20 (!) 21 (!) 178 16  Temp:      TempSrc:      SpO2: 98% 97% 100% 98%  Weight:      Height:         Vitals:   05/11/21 2216 05/12/21 0451 05/12/21 0606 05/12/21 0835  BP: (!) 156/95 (!) 145/67 (!) 162/63 133/70  Pulse: 95 (!) 101 68 78  Resp: 20 (!) 21 (!) 178 16  Temp:      TempSrc:       SpO2: 98% 97% 100% 98%  Weight:      Height:          Constitutional: Alert and oriented only to person. Not in any apparent distress HEENT:      Head: Normocephalic and atraumatic.         Eyes: PERLA, EOMI, Conjunctivae are normal. Sclera is non-icteric.       Mouth/Throat: Mucous membranes are moist.       Neck: Supple with no signs of meningismus. Cardiovascular: Tachycardic. No murmurs, gallops, or rubs. 2+ symmetrical distal pulses are present . No JVD. Trace LE edema Respiratory: Respiratory effort normal .Lungs sounds clear bilaterally. No wheezes, crackles, or rhonchi.  Gastrointestinal: Soft, non tender, and non distended with positive bowel sounds.  Genitourinary: No CVA tenderness. Musculoskeletal: Decrease ROM right hip. No cyanosis, or erythema of extremities. Neurologic:  Face is symmetric. Difficulty moving his right arm and leg. Skin: Skin is warm, dry.  No rash or ulcers Psychiatric: Mood and affect are normal    Labs on Admission: I have personally reviewed following labs and imaging studies  CBC: Recent Labs  Lab 05/11/21 1613  WBC 11.2*  HGB 14.5  HCT 42.4  MCV 94.4  PLT 243   Basic Metabolic Panel: Recent Labs  Lab 05/11/21 1613  NA 136  K 3.4*  CL 98  CO2 25  GLUCOSE 165*  BUN 14  CREATININE 0.79  CALCIUM 9.9   GFR: Estimated Creatinine Clearance: 66.3 mL/min (by C-G formula based on SCr of 0.79 mg/dL). Liver Function Tests: No results for input(s): AST, ALT, ALKPHOS, BILITOT, PROT, ALBUMIN in the last 168 hours. No results for input(s): LIPASE, AMYLASE in the last 168 hours. No results for input(s): AMMONIA in the last 168 hours. Coagulation Profile: Recent Labs  Lab 05/12/21 0400  INR 1.1   Cardiac Enzymes: No results for input(s): CKTOTAL, CKMB, CKMBINDEX, TROPONINI in the last 168 hours. BNP (last 3 results) No results for input(s): PROBNP in the last 8760 hours. HbA1C: No results for input(s): HGBA1C in the last 72  hours. CBG: No results for input(s): GLUCAP in the last 168 hours. Lipid Profile: No results for input(s): CHOL, HDL, LDLCALC, TRIG, CHOLHDL, LDLDIRECT in the last 72 hours. Thyroid Function Tests: No results for input(s): TSH, T4TOTAL, FREET4, T3FREE, THYROIDAB in the last 72 hours. Anemia Panel: No results for input(s): VITAMINB12, FOLATE, FERRITIN, TIBC, IRON, RETICCTPCT in the last 72 hours. Urine analysis:    Component Value Date/Time   COLORURINE YELLOW (A) 05/12/2021 0051   APPEARANCEUR CLEAR (A) 05/12/2021 0051   LABSPEC 1.015 05/12/2021 0051   PHURINE 5.0 05/12/2021 0051   GLUCOSEU >=500 (A) 05/12/2021 0051   HGBUR SMALL (A) 05/12/2021 0051   BILIRUBINUR NEGATIVE 05/12/2021 0051  KETONESUR 20 (A) 05/12/2021 0051   PROTEINUR 30 (A) 05/12/2021 0051   NITRITE NEGATIVE 05/12/2021 0051   LEUKOCYTESUR NEGATIVE 05/12/2021 0051    Radiological Exams on Admission: DG Shoulder Right  Result Date: 05/11/2021 CLINICAL DATA:  Fall, pain EXAM: RIGHT SHOULDER - 2+ VIEW COMPARISON:  None. FINDINGS: Degenerative changes in the right shoulder with joint space narrowing and spurring. Prior resection of the distal clavicle. No acute bony abnormality. Specifically, no fracture, subluxation, or dislocation. IMPRESSION: Advanced degenerative changes.  No acute bony abnormality. Electronically Signed   By: Charlett Nose M.D.   On: 05/11/2021 20:18   DG Forearm Right  Result Date: 05/11/2021 CLINICAL DATA:  Fall, pain EXAM: RIGHT FOREARM - 2 VIEW COMPARISON:  None. FINDINGS: Advanced degenerative changes at the right elbow. Extensive spurring. Intra-articular loose bodies present. No visible fracture. No subluxation or dislocation. IMPRESSION: No acute bony abnormality. Electronically Signed   By: Charlett Nose M.D.   On: 05/11/2021 20:17   DG Wrist Complete Right  Result Date: 05/11/2021 CLINICAL DATA:  Fall, pain EXAM: RIGHT WRIST - COMPLETE 3+ VIEW COMPARISON:  None. FINDINGS: Mild  degenerative changes in the wrist. No acute bony abnormality. Specifically, no fracture, subluxation, or dislocation. IMPRESSION: No acute bony abnormality. Electronically Signed   By: Charlett Nose M.D.   On: 05/11/2021 20:17   CT HEAD WO CONTRAST  Result Date: 05/11/2021 CLINICAL DATA:  Status post fall.  87 year old male. EXAM: CT HEAD WITHOUT CONTRAST TECHNIQUE: Contiguous axial images were obtained from the base of the skull through the vertex without intravenous contrast. COMPARISON:  August 02, 2020. FINDINGS: Brain: No evidence of acute infarction, hemorrhage, hydrocephalus, extra-axial collection or mass lesion/mass effect. Signs of atrophy and chronic microvascular ischemic change as before. Vascular: No hyperdense vessel or unexpected calcification. Skull: Normal. Negative for fracture or focal lesion. Sinuses/Orbits: Visualized paranasal sinuses and orbits are unremarkable. Other: None IMPRESSION: No acute intracranial pathology. Signs of atrophy and chronic microvascular ischemic change as before. Electronically Signed   By: Donzetta Kohut M.D.   On: 05/11/2021 14:25   CT Cervical Spine Wo Contrast  Result Date: 05/11/2021 CLINICAL DATA:  Neck trauma in a 87 year old male. EXAM: CT CERVICAL SPINE WITHOUT CONTRAST TECHNIQUE: Multidetector CT imaging of the cervical spine was performed without intravenous contrast. Multiplanar CT image reconstructions were also generated. COMPARISON:  Comparison made with head exam of the same date and previous C-spine evaluation from August 02, 2020. FINDINGS: Alignment: Straightening of normal cervical lordotic curvature is similar to the prior study. Minimal anterolisthesis of C6 on C7 in the setting of degenerative changes is a finding that is unchanged. Skull base and vertebrae: No acute fracture. No primary bone lesion or focal pathologic process. Soft tissues and spinal canal: No prevertebral fluid or swelling. No visible canal hematoma. Disc levels:  Multilevel degenerative changes similar to the prior study with marked atlantoaxial and atlantooccipital degenerative change as well as disc space narrowing, uncovertebral spurring and facet arthropathy. Upper chest: Negative. Other: None IMPRESSION: No acute fracture or dislocation in the cervical spine. Multilevel degenerative changes similar to the prior study. Electronically Signed   By: Donzetta Kohut M.D.   On: 05/11/2021 14:14   MR BRAIN WO CONTRAST  Result Date: 05/11/2021 CLINICAL DATA:  Stroke suspected, right leg weakness EXAM: MRI HEAD WITHOUT CONTRAST TECHNIQUE: Multiplanar, multiecho pulse sequences of the brain and surrounding structures were obtained without intravenous contrast. COMPARISON:  No prior brain MRI, correlation is made with 05/11/2021 CT head FINDINGS:  Evaluation is somewhat limited as the patient not tolerate the exam, which is incomplete. Only diffusion-weighted imaging and a sagittal T1 sequence were obtained, the sagittal T1 being severely limited by motion artifact. Within this limitation, there is no restricted diffusion to suggest acute infarct. Cerebral volume loss, with commensurate size the ventricles and sulci. IMPRESSION: Limited exam, as the patient was unable to complete the study. On diffusion-weighted imaging, there is no evidence of acute infarct. Electronically Signed   By: Wiliam Ke M.D.   On: 05/11/2021 23:58   DG Chest Port 1 View  Result Date: 05/12/2021 CLINICAL DATA:  Fall, lethargy EXAM: PORTABLE CHEST 1 VIEW COMPARISON:  08/02/2020 FINDINGS: Lungs are clear.  No pleural effusion or pneumothorax. Heart is top-normal in size. IMPRESSION: No evidence of acute cardiopulmonary disease. Electronically Signed   By: Charline Bills M.D.   On: 05/12/2021 00:24   DG Humerus Right  Result Date: 05/11/2021 CLINICAL DATA:  Fall, pain EXAM: RIGHT HUMERUS - 2+ VIEW COMPARISON:  None. FINDINGS: No acute bony abnormality. Specifically, no fracture,  subluxation, or dislocation. Degenerative changes in the shoulder. Soft tissues are intact. IMPRESSION: No acute bony abnormality. Electronically Signed   By: Charlett Nose M.D.   On: 05/11/2021 20:16   DG Hand Complete Right  Result Date: 05/11/2021 CLINICAL DATA:  Fall, pain EXAM: RIGHT HAND - COMPLETE 3+ VIEW COMPARISON:  None. FINDINGS: No acute bony abnormality. Specifically, no fracture, subluxation, or dislocation. Mild degenerative changes in the IP joints and wrist. IMPRESSION: No acute bony abnormality. Electronically Signed   By: Charlett Nose M.D.   On: 05/11/2021 20:15     Assessment/Plan Principal Problem:   Unspecified atrial fibrillation (HCC) Active Problems:   NSTEMI (non-ST elevated myocardial infarction) (HCC)   Dementia without behavioral disturbance (HCC)   Fall     Patient is an 87 year old male with a history of advanced dementia who was brought into the ER by EMS for evaluation following a fall with right-sided weakness.   Atrial fibrillation Appears to be new onset Patient is currently rate controlled Patient has a CHA2DS2-VASc score of 2 We will start metoprolol 25 mg twice daily Continue heparin drip initiated in the ER and will transition to oral anticoagulants if appropriate Obtain 2D echocardiogram to assess LVEF and rule out valvular pathology     Elevated troponin Concerning for possible non-ST elevation MI Patient has an uptrending troponin level ( 21 >> 78) Continue heparin initiated in the ER Continue low-dose beta-blocker    Fall With right arm pain and weakness as well as inability to bear weight on his right leg Patient has had extensive imaging of his right arm which is negative Follow-up results of x-ray of right hip and knee Place patient on fall precautions Obtain total CK levels      Dementia Continue lorazepam and mirtazapine  DVT prophylaxis: Heparin Code Status: DO NOT RESUSCITATE Family Communication: Greater than  50% of time was spent discussing patient's condition and plan of care with his wife and healthcare power of attorney Donnamarie Poag who was at the bedside.  All questions and concerns have been addressed. She verbalizes understanding and agrees with the plan.  CODE STATUS was discussed and he is a DO NOT RESUSCITATE.  Disposition Plan: Back to previous home environment Consults called: Cardiology  Status:At the time of admission, it appears that the appropriate admission status for this patient is inpatient. This is judged to be reasonable and necessary to provide the required intensity of  service to ensure the patient's safety given the presenting symptoms, physical exam findings, and initial radiographic and laboratory data in the context of their comorbid conditions. Patient requires inpatient status due to high intensity of service, high risk for further deterioration and high frequency of surveillance required.     Lucile Shutters MD [facility]     05/12/2021, 9:19 AM

## 2021-05-13 ENCOUNTER — Inpatient Hospital Stay
Admit: 2021-05-13 | Discharge: 2021-05-13 | Disposition: A | Discharge status: Home / Self Care | Payer: Medicare Other | Attending: Internal Medicine | Admitting: Internal Medicine

## 2021-05-13 LAB — LIPID PANEL
Cholesterol: 105 mg/dL (ref 0–200)
HDL: 48 mg/dL (ref >40)
LDL Cholesterol: 47 mg/dL (ref 0–99)
Total CHOL/HDL Ratio: 2.2 RATIO
Triglycerides: 49 mg/dL (ref <150)
VLDL: 10 mg/dL (ref 0–40)

## 2021-05-13 LAB — CBC
HCT: 37.8 % — ABNORMAL LOW (ref 39.0–52.0)
Hemoglobin: 13.1 g/dL (ref 13.0–17.0)
MCH: 32 pg (ref 26.0–34.0)
MCHC: 34.7 g/dL (ref 30.0–36.0)
MCV: 92.4 fL (ref 80.0–100.0)
Platelets: 225 10*3/uL (ref 150–400)
RBC: 4.09 MIL/uL — ABNORMAL LOW (ref 4.22–5.81)
RDW: 13.4 % (ref 11.5–15.5)
WBC: 11.2 10*3/uL — ABNORMAL HIGH (ref 4.0–10.5)
nRBC: 0 % (ref 0.0–0.2)

## 2021-05-13 LAB — BASIC METABOLIC PANEL
Anion gap: 9 (ref 5–15)
BUN: 14 mg/dL (ref 8–23)
CO2: 22 mmol/L (ref 22–32)
Calcium: 9.4 mg/dL (ref 8.9–10.3)
Chloride: 105 mmol/L (ref 98–111)
Creatinine, Ser: 0.71 mg/dL (ref 0.61–1.24)
GFR, Estimated: >60 mL/min (ref >60)
Glucose, Bld: 119 mg/dL — ABNORMAL HIGH (ref 70–99)
Potassium: 3.4 mmol/L — ABNORMAL LOW (ref 3.5–5.1)
Sodium: 136 mmol/L (ref 135–145)

## 2021-05-13 LAB — ECHOCARDIOGRAM COMPLETE
AR max vel: 1.87 cm2
AV Area VTI: 2.03 cm2
AV Area mean vel: 1.96 cm2
AV Mean grad: 3.7 mmHg
AV Peak grad: 6.8 mmHg
Ao pk vel: 1.3 m/s
Area-P 1/2: 3.11 cm2
S' Lateral: 2.71 cm

## 2021-05-13 LAB — HEPARIN LEVEL (UNFRACTIONATED)
Heparin Unfractionated: 0.19 IU/mL — ABNORMAL LOW (ref 0.30–0.70)
Heparin Unfractionated: 0.57 IU/mL (ref 0.30–0.70)
Heparin Unfractionated: 0.6 IU/mL (ref 0.30–0.70)

## 2021-05-13 LAB — RESP PANEL BY RT-PCR (FLU A&B, COVID) ARPGX2
Influenza A by PCR: NEGATIVE
Influenza B by PCR: NEGATIVE
SARS Coronavirus 2 by RT PCR: NEGATIVE

## 2021-05-13 MED ORDER — HEPARIN BOLUS VIA INFUSION
2500.0000 [IU] | Freq: Once | INTRAVENOUS | Status: AC
  Administered 2021-05-13: 2500 [IU] via INTRAVENOUS
  Filled 2021-05-13: qty 2500

## 2021-05-13 MED ORDER — LORAZEPAM 0.5 MG PO TABS
0.5000 mg | ORAL_TABLET | Freq: Once | ORAL | Status: DC
Start: 2021-05-13 — End: 2021-05-16
  Filled 2021-05-13: qty 1

## 2021-05-13 MED ORDER — APIXABAN 5 MG PO TABS
5.0000 mg | ORAL_TABLET | Freq: Two times a day (BID) | ORAL | Status: DC
  Administered 2021-05-13 – 2021-05-15 (×4): 5 mg via ORAL
  Filled 2021-05-13 (×4): qty 1

## 2021-05-13 NOTE — Progress Notes (Signed)
PROGRESS NOTE    Nocholas Sutton  QJJ:[id number] DOB: 1932/03/18 DOA: 05/12/2021 PCP: [facility]    Brief Narrative:  This 87 years old male with PMH significant for dementia, dyslipidemia, ischemic optic neuropathy presented in the ED for the evaluation of right-sided weakness following a fall.  Patient's wife reports that she received a call from memory care unit that he has fallen, after she arrived she noticed he was unable to use his right side and unable to bear weight on his right leg.  Patient was last seen normal by his wife a week ago. CT head without any acute intracranial pathology.  CT C-spine no fracture or dislocation.  Patient had extensive imaging of right hand which is negative.  MRI brain without contrast: No evidence of acute infarct.  EKG shows A. fib.  Patient is admitted for A. fib with RVR and started on IV heparin.  Cardiology is consulted.  Assessment & Plan:   Principal Problem:   Unspecified atrial fibrillation (HCC) Active Problems:   NSTEMI (non-ST elevated myocardial infarction) (HCC)   Dementia without behavioral disturbance (HCC)   Fall   Atrial fibrillation with RVR.   It appears to be new onset. Patient is currently rate controlled. Patient has CHADS2 score of 2. Continue metoprolol 25 mg twice daily for rate control. Continue heparin drip ,  we will transition to oral anticoagulant if appropriate. 2D echocardiogram shows LVEF 55 to 60%, normal LV function.  No RWMA. Cardiology consulted, recommended to continue current management.  Elevated troponin: This could be secondary to demand ischemia in the setting of A. fib with RVR Continue to trend troponin. Continue heparin gtt. Continue low-dose beta-blocker.   Status post Fall: Patient with right arm pain and weakness, inability to weight on right leg Patient had extensive imaging of the right arm which is negative Continue fall precautions. Right knee and right hip x-ray no  fracture or dislocation.  Dementia: Stable remains at baseline.  Depression and anxiety: Continue Remeron and lorazepam    DVT prophylaxis: Heparin GTT Code Status: DNR Family Communication: Spouse at bedside Disposition Plan:    Status is: Inpatient  Remains inpatient appropriate because: New onset A. fib, work-up and management.  Anticipated discharge home in 1 to 2 days.   Consultants:  Cardiology  Procedures: Echocardiogram Antimicrobials: None  Subjective: Patient was seen and examined at bedside.  Overnight events noted.   Patient was agitated and restless and was given some lorazepam.  Patient sleeping.  Heart rate remains controlled.  Objective: Vitals:   05/13/21 0425 05/13/21 0754 05/13/21 1237 05/13/21 1522  BP: 108/67 113/87 (!) 110/55 123/63  Pulse: 69 76 62 74  Resp: 18 18 18 16   Temp: 98.6 F (37 C) 98.6 F (37 C) 98.4 F (36.9 C)   TempSrc: Axillary Axillary Oral   SpO2: 98% 99% 98% 98%  Weight:      Height:        Intake/Output Summary (Last 24 hours) at 05/13/2021 1549 Last data filed at 05/13/2021 0938 Gross per 24 hour  Intake 424.15 ml  Output --  Net 424.15 ml   Filed Weights   05/11/21 1301  Weight: 84.4 kg    Examination:  General exam: Appears comfortable, not in any acute distress, deconditioned. Respiratory system: Clear to auscultation. Respiratory effort normal. Cardiovascular system: S1-S2 heard, irregular rhythm, no murmur.   Gastrointestinal system: Abdomen is soft, nontender, nondistended, BS + Central nervous system: Alert and oriented. No focal neurological deficits.  Extremities: No edema, no clubbing, no cyanosis. Skin: No rashes, lesions or ulcers Psychiatry:Mood & affect appropriate.     Data Reviewed: I have personally reviewed following labs and imaging studies  CBC: Recent Labs  Lab 05/11/21 1613 05/13/21 0838  WBC 11.2* 11.2*  HGB 14.5 13.1  HCT 42.4 37.8*  MCV 94.4 92.4  PLT 243 225    Basic Metabolic Panel: Recent Labs  Lab 05/11/21 1613 05/13/21 0838  NA 136 136  K 3.4* 3.4*  CL 98 105  CO2 25 22  GLUCOSE 165* 119*  BUN 14 14  CREATININE 0.79 0.71  CALCIUM 9.9 9.4   GFR: Estimated Creatinine Clearance: 66.3 mL/min (by C-G formula based on SCr of 0.71 mg/dL). Liver Function Tests: No results for input(s): AST, ALT, ALKPHOS, BILITOT, PROT, ALBUMIN in the last 168 hours. No results for input(s): LIPASE, AMYLASE in the last 168 hours. No results for input(s): AMMONIA in the last 168 hours. Coagulation Profile: Recent Labs  Lab 05/12/21 0400  INR 1.1   Cardiac Enzymes: Recent Labs  Lab 05/12/21 1345  CKTOTAL 396   BNP (last 3 results) No results for input(s): PROBNP in the last 8760 hours. HbA1C: No results for input(s): HGBA1C in the last 72 hours. CBG: No results for input(s): GLUCAP in the last 168 hours. Lipid Profile: Recent Labs    05/13/21 0838  CHOL 105  HDL 48  LDLCALC 47  TRIG 49  CHOLHDL 2.2   Thyroid Function Tests: Recent Labs    05/12/21 1345  TSH 0.683   Anemia Panel: No results for input(s): VITAMINB12, FOLATE, FERRITIN, TIBC, IRON, RETICCTPCT in the last 72 hours. Sepsis Labs: No results for input(s): PROCALCITON, LATICACIDVEN in the last 168 hours.  Recent Results (from the past 240 hour(s))  Resp Panel by RT-PCR (Flu A&B, Covid) Nasopharyngeal Swab     Status: None   Collection Time: 05/13/21  7:53 AM   Specimen: Nasopharyngeal Swab; Nasopharyngeal(NP) swabs in vial transport medium  Result Value Ref Range Status   SARS Coronavirus 2 by RT PCR NEGATIVE NEGATIVE Final    Comment: (NOTE) SARS-CoV-2 target nucleic acids are NOT DETECTED.  The SARS-CoV-2 RNA is generally detectable in upper respiratory specimens during the acute phase of infection. The lowest concentration of SARS-CoV-2 viral copies this assay can detect is 138 copies/mL. A negative result does not preclude SARS-Cov-2 infection and should not  be used as the sole basis for treatment or other patient management decisions. A negative result may occur with  improper specimen collection/handling, submission of specimen other than nasopharyngeal swab, presence of viral mutation(s) within the areas targeted by this assay, and inadequate number of viral copies(<138 copies/mL). A negative result must be combined with clinical observations, patient history, and epidemiological information. The expected result is Negative.  Fact Sheet for Patients:  BloggerCourse.com  Fact Sheet for Healthcare Providers:  SeriousBroker.it  This test is no t yet approved or cleared by the [place] FDA and  has been authorized for detection and/or diagnosis of SARS-CoV-2 by FDA under an Emergency Use Authorization (EUA). This EUA will remain  in effect (meaning this test can be used) for the duration of the COVID-19 declaration under Section 564(b)(1) of the Act, 21 U.S.C.section 360bbb-3(b)(1), unless the authorization is terminated  or revoked sooner.       Influenza A by PCR NEGATIVE NEGATIVE Final   Influenza B by PCR NEGATIVE NEGATIVE Final    Comment: (NOTE) The Xpert Xpress SARS-CoV-2/FLU/RSV plus assay is  intended as an aid in the diagnosis of influenza from Nasopharyngeal swab specimens and should not be used as a sole basis for treatment. Nasal washings and aspirates are unacceptable for Xpert Xpress SARS-CoV-2/FLU/RSV testing.  Fact Sheet for Patients: BloggerCourse.com  Fact Sheet for Healthcare Providers: SeriousBroker.it  This test is not yet approved or cleared by the [place] FDA and has been authorized for detection and/or diagnosis of SARS-CoV-2 by FDA under an Emergency Use Authorization (EUA). This EUA will remain in effect (meaning this test can be used) for the duration of the COVID-19 declaration under Section  564(b)(1) of the Act, 21 U.S.C. section 360bbb-3(b)(1), unless the authorization is terminated or revoked.  Performed at [facility], [address] [place], [place] [address]     Radiology Studies: DG Shoulder Right  Result Date: 05/11/2021 CLINICAL DATA:  Fall, pain EXAM: RIGHT SHOULDER - 2+ VIEW COMPARISON:  None. FINDINGS: Degenerative changes in the right shoulder with joint space narrowing and spurring. Prior resection of the distal clavicle. No acute bony abnormality. Specifically, no fracture, subluxation, or dislocation. IMPRESSION: Advanced degenerative changes.  No acute bony abnormality. Electronically Signed   By: Charlett Nose M.D.   On: 05/11/2021 20:18   DG Forearm Right  Result Date: 05/11/2021 CLINICAL DATA:  Fall, pain EXAM: RIGHT FOREARM - 2 VIEW COMPARISON:  None. FINDINGS: Advanced degenerative changes at the right elbow. Extensive spurring. Intra-articular loose bodies present. No visible fracture. No subluxation or dislocation. IMPRESSION: No acute bony abnormality. Electronically Signed   By: Charlett Nose M.D.   On: 05/11/2021 20:17   DG Wrist Complete Right  Result Date: 05/11/2021 CLINICAL DATA:  Fall, pain EXAM: RIGHT WRIST - COMPLETE 3+ VIEW COMPARISON:  None. FINDINGS: Mild degenerative changes in the wrist. No acute bony abnormality. Specifically, no fracture, subluxation, or dislocation. IMPRESSION: No acute bony abnormality. Electronically Signed   By: Charlett Nose M.D.   On: 05/11/2021 20:17   DG Knee 1-2 Views Right  Result Date: 05/12/2021 CLINICAL DATA:  87 year old male status post fall yesterday with pain. EXAM: RIGHT KNEE - 1-2 VIEW COMPARISON:  None. FINDINGS: Total knee arthroplasty. Hardware appears intact and normally aligned. Postoperative changes to the patella. No definite joint effusion on cross-table lateral view. Calcified peripheral vascular disease. No acute osseous abnormality identified. IMPRESSION: Right knee total  arthroplasty. No acute fracture or dislocation identified. Electronically Signed   By: Odessa Fleming M.D.   On: 05/12/2021 10:02   MR BRAIN WO CONTRAST  Result Date: 05/11/2021 CLINICAL DATA:  Stroke suspected, right leg weakness EXAM: MRI HEAD WITHOUT CONTRAST TECHNIQUE: Multiplanar, multiecho pulse sequences of the brain and surrounding structures were obtained without intravenous contrast. COMPARISON:  No prior brain MRI, correlation is made with 05/11/2021 CT head FINDINGS: Evaluation is somewhat limited as the patient not tolerate the exam, which is incomplete. Only diffusion-weighted imaging and a sagittal T1 sequence were obtained, the sagittal T1 being severely limited by motion artifact. Within this limitation, there is no restricted diffusion to suggest acute infarct. Cerebral volume loss, with commensurate size the ventricles and sulci. IMPRESSION: Limited exam, as the patient was unable to complete the study. On diffusion-weighted imaging, there is no evidence of acute infarct. Electronically Signed   By: Wiliam Ke M.D.   On: 05/11/2021 23:58   DG Chest Port 1 View  Result Date: 05/12/2021 CLINICAL DATA:  Fall, lethargy EXAM: PORTABLE CHEST 1 VIEW COMPARISON:  08/02/2020 FINDINGS: Lungs are clear.  No pleural effusion or pneumothorax.  Heart is top-normal in size. IMPRESSION: No evidence of acute cardiopulmonary disease. Electronically Signed   By: Charline Bills M.D.   On: 05/12/2021 00:24   DG Humerus Right  Result Date: 05/11/2021 CLINICAL DATA:  Fall, pain EXAM: RIGHT HUMERUS - 2+ VIEW COMPARISON:  None. FINDINGS: No acute bony abnormality. Specifically, no fracture, subluxation, or dislocation. Degenerative changes in the shoulder. Soft tissues are intact. IMPRESSION: No acute bony abnormality. Electronically Signed   By: Charlett Nose M.D.   On: 05/11/2021 20:16   DG Hand Complete Right  Result Date: 05/11/2021 CLINICAL DATA:  Fall, pain EXAM: RIGHT HAND - COMPLETE 3+ VIEW  COMPARISON:  None. FINDINGS: No acute bony abnormality. Specifically, no fracture, subluxation, or dislocation. Mild degenerative changes in the IP joints and wrist. IMPRESSION: No acute bony abnormality. Electronically Signed   By: Charlett Nose M.D.   On: 05/11/2021 20:15   ECHOCARDIOGRAM COMPLETE  Result Date: 05/13/2021    ECHOCARDIOGRAM REPORT   Patient Name:   Stanley Sutton Date of Exam: 05/13/2021 Medical Rec #:  [id number]        Height:       69.0 in Accession #:    [id number]       Weight:       186.0 lb Date of Birth:  Mar 13, 1932       BSA:          2.003 m Patient Age:    88 years         BP:           113/87 mmHg Patient Gender: M                HR:           76 bpm. Exam Location:  [facility] Procedure: 2D Echo, Cardiac Doppler and Color Doppler Indications:     Atrial Fibrillation I48.91  History:         Patient has no prior history of Echocardiogram examinations.                  Dementia.  Sonographer:     Cristela Blue Referring Phys:  [id number] AGBATA Diagnosing Phys: Arnoldo Hooker MD  Sonographer Comments: Technically challenging study due to limited acoustic windows and no parasternal window. IMPRESSIONS  1. Left ventricular ejection fraction, by estimation, is 55 to 60%. The left ventricle has normal function. The left ventricle has no regional wall motion abnormalities. Left ventricular diastolic parameters were normal.  2. Right ventricular systolic function is normal. The right ventricular size is normal.  3. The mitral valve is normal in structure. Mild mitral valve regurgitation.  4. The aortic valve is normal in structure. Aortic valve regurgitation is not visualized. FINDINGS  Left Ventricle: Left ventricular ejection fraction, by estimation, is 55 to 60%. The left ventricle has normal function. The left ventricle has no regional wall motion abnormalities. The left ventricular internal cavity size was normal in size. There is  no left ventricular hypertrophy. Left ventricular  diastolic parameters were normal. Right Ventricle: The right ventricular size is normal. No increase in right ventricular wall thickness. Right ventricular systolic function is normal. Left Atrium: Left atrial size was normal in size. Right Atrium: Right atrial size was normal in size. Pericardium: There is no evidence of pericardial effusion. Mitral Valve: The mitral valve is normal in structure. Mild mitral valve regurgitation. Tricuspid Valve: The tricuspid valve is normal in structure. Tricuspid valve regurgitation is trivial. Aortic Valve: The aortic valve  is normal in structure. Aortic valve regurgitation is not visualized. Aortic valve mean gradient measures 3.7 mmHg. Aortic valve peak gradient measures 6.8 mmHg. Aortic valve area, by VTI measures 2.03 cm. Pulmonic Valve: The pulmonic valve was normal in structure. Pulmonic valve regurgitation is not visualized. Aorta: The aortic root and ascending aorta are structurally normal, with no evidence of dilitation. IAS/Shunts: No atrial level shunt detected by color flow Doppler.  LEFT VENTRICLE PLAX 2D LVIDd:         4.09 cm   Diastology LVIDs:         2.71 cm   LV e' medial:    6.09 cm/s LV PW:         0.97 cm   LV E/e' medial:  15.2 LV IVS:        0.77 cm   LV e' lateral:   7.07 cm/s LVOT diam:     2.00 cm   LV E/e' lateral: 13.1 LV SV:         52 LV SV Index:   26 LVOT Area:     3.14 cm  RIGHT VENTRICLE RV Basal diam:  3.24 cm RV S prime:     12.40 cm/s TAPSE (M-mode): 3.8 cm LEFT ATRIUM             Index        RIGHT ATRIUM           Index LA diam:        3.10 cm 1.55 cm/m   RA Area:     11.10 cm LA Vol (A2C):   24.6 ml 12.28 ml/m  RA Volume:   26.00 ml  12.98 ml/m LA Vol (A4C):   31.0 ml 15.47 ml/m LA Biplane Vol: 30.4 ml 15.18 ml/m  AORTIC VALVE AV Area (Vmax):    1.87 cm AV Area (Vmean):   1.96 cm AV Area (VTI):     2.03 cm AV Vmax:           130.33 cm/s AV Vmean:          88.033 cm/s AV VTI:            0.256 m AV Peak Grad:      6.8 mmHg AV  Mean Grad:      3.7 mmHg LVOT Vmax:         77.70 cm/s LVOT Vmean:        54.900 cm/s LVOT VTI:          0.165 m LVOT/AV VTI ratio: 0.65  AORTA Ao Root diam: 3.20 cm MITRAL VALVE               TRICUSPID VALVE MV Area (PHT): 3.11 cm    TR Peak grad:   8.3 mmHg MV Decel Time: 244 msec    TR Vmax:        144.00 cm/s MV E velocity: 92.50 cm/s MV A velocity: 96.40 cm/s  SHUNTS MV E/A ratio:  0.96        Systemic VTI:  0.16 m                            Systemic Diam: 2.00 cm Arnoldo Hooker MD Electronically signed by Arnoldo Hooker MD Signature Date/Time: 05/13/2021/1:31:58 PM    Final    DG HIP UNILAT WITH PELVIS 2-3 VIEWS RIGHT  Result Date: 05/12/2021 CLINICAL DATA:  Larey Seat yesterday.  Hip pain. EXAM: DG HIP (WITH OR WITHOUT  PELVIS) 2-3V RIGHT COMPARISON:  None. FINDINGS: No evidence of pelvic or hip fracture. Lower lumbar degenerative changes incidentally noted. IMPRESSION: No acute or traumatic finding. Electronically Signed   By: Paulina Fusi M.D.   On: 05/12/2021 10:01     Scheduled Meds:  ammonium lactate   Topical Daily   fentaNYL (SUBLIMAZE) injection  12.5 mcg Intravenous Once   LORazepam  0.5 mg Oral Once   metoprolol tartrate  25 mg Oral BID   mirtazapine  15 mg Oral QHS   pantoprazole  40 mg Oral Daily   risperiDONE  0.5 mg Oral QHS   simvastatin  20 mg Oral Daily   traZODone  50 mg Oral QHS   Continuous Infusions:  heparin 1,500 Units/hr (05/13/21 1409)     LOS: 1 day    Time spent: 35 mins    Beau Vanduzer, MD [facility]   If 7PM-7AM, please contact night-coverage

## 2021-05-13 NOTE — Progress Notes (Signed)
[facility] Cardiology [facility] Encounter Note  Patient: Stanley Sutton / Admit Date: 05/12/2021 / Date of Encounter: 05/13/2021, 1:11 PM   Subjective: 87 y.o. male with a past medical history significant for dementia, hyperlipidemia who presented to the ED by EMS for evaluation of new onset right-sided weakness associated with fall.  Patient's wife stated that staff at the assisted living facility had found the patient on the floor with complaints of right-sided weakness, unable to use his right side or bear weight on his right leg.  CT of the head showed no acute intracranial pathology, brain MRI showed limited exam as the patient was unable to complete the study, on diffusion-weighted imaging there was no evidence of acute infarct.  CXR showed no evidence of acute cardiopulmonary disease.  On presentation to the ED patient's EKG showed the patient was in atrial fibrillation at a controlled rate of 91 bpm.  Patient does not currently have any past medical history of atrial fibrillation and is not on any blood thinners.  Initial troponin of 21 trending upward to 78, now 89  Review of Systems: Patient unable to participate in ROS due to dementia and sleeping  Objective: Telemetry: Atrial fibrillation Physical Exam: Blood pressure (!) 110/55, pulse 62, temperature 98.4 F (36.9 C), temperature source Oral, resp. rate 18, height 5\' 9"  (1.753 m), weight 84.4 kg, SpO2 98 %. Body mass index is 27.47 kg/m. General: No acute distress Head: Normocephalic, atraumatic, sclera non-icteric, no xanthomas, nares are without discharge. Neck: No apparent masses Lungs: Normal respirations with no wheezes, no rhonchi, no rales , no crackles   Heart: irregular rate and rhythm, normal S1 S2, no murmur, no rub, no gallop, PMI is normal size and placement, carotid upstroke normal without bruit, jugular venous pressure normal Abdomen: Soft, non-tender, non-distended with normoactive bowel sounds. No  hepatosplenomegaly. Abdominal aorta is normal size without bruit Extremities: No edema, no clubbing, no cyanosis, no ulcers,  Peripheral: 2+ radial, 2+ femoral, 2+ dorsal pedal pulses Neuro: Sleeping.  Not responding to questions Psych: Not responding to questions   Intake/Output Summary (Last 24 hours) at 05/13/2021 1311 Last data filed at 05/13/2021 0938 Gross per 24 hour  Intake 424.15 ml  Output --  Net 424.15 ml    Inpatient Medications:   ammonium lactate   Topical Daily   fentaNYL (SUBLIMAZE) injection  12.5 mcg Intravenous Once   LORazepam  0.5 mg Oral Once   metoprolol tartrate  25 mg Oral BID   mirtazapine  15 mg Oral QHS   pantoprazole  40 mg Oral Daily   risperiDONE  0.5 mg Oral QHS   simvastatin  20 mg Oral Daily   traZODone  50 mg Oral QHS   Infusions:   heparin 1,500 Units/hr (05/13/21 0938)    Labs: Recent Labs    05/11/21 1613 05/13/21 0838  NA 136 136  K 3.4* 3.4*  CL 98 105  CO2 25 22  GLUCOSE 165* 119*  BUN 14 14  CREATININE 0.79 0.71  CALCIUM 9.9 9.4   No results for input(s): AST, ALT, ALKPHOS, BILITOT, PROT, ALBUMIN in the last 72 hours. Recent Labs    05/11/21 1613 05/13/21 0838  WBC 11.2* 11.2*  HGB 14.5 13.1  HCT 42.4 37.8*  MCV 94.4 92.4  PLT 243 225   Recent Labs    05/12/21 1345  CKTOTAL 396   Invalid input(s): POCBNP No results for input(s): HGBA1C in the last 72 hours.   Weights: 05/14/21   05/11/21  1301  Weight: 84.4 kg     Radiology/Studies:  DG Shoulder Right  Result Date: 05/11/2021 CLINICAL DATA:  Fall, pain EXAM: RIGHT SHOULDER - 2+ VIEW COMPARISON:  None. FINDINGS: Degenerative changes in the right shoulder with joint space narrowing and spurring. Prior resection of the distal clavicle. No acute bony abnormality. Specifically, no fracture, subluxation, or dislocation. IMPRESSION: Advanced degenerative changes.  No acute bony abnormality. Electronically Signed   By: Charlett Nose M.D.   On: 05/11/2021  20:18   DG Forearm Right  Result Date: 05/11/2021 CLINICAL DATA:  Fall, pain EXAM: RIGHT FOREARM - 2 VIEW COMPARISON:  None. FINDINGS: Advanced degenerative changes at the right elbow. Extensive spurring. Intra-articular loose bodies present. No visible fracture. No subluxation or dislocation. IMPRESSION: No acute bony abnormality. Electronically Signed   By: Charlett Nose M.D.   On: 05/11/2021 20:17   DG Wrist Complete Right  Result Date: 05/11/2021 CLINICAL DATA:  Fall, pain EXAM: RIGHT WRIST - COMPLETE 3+ VIEW COMPARISON:  None. FINDINGS: Mild degenerative changes in the wrist. No acute bony abnormality. Specifically, no fracture, subluxation, or dislocation. IMPRESSION: No acute bony abnormality. Electronically Signed   By: Charlett Nose M.D.   On: 05/11/2021 20:17   DG Knee 1-2 Views Right  Result Date: 05/12/2021 CLINICAL DATA:  87 year old male status post fall yesterday with pain. EXAM: RIGHT KNEE - 1-2 VIEW COMPARISON:  None. FINDINGS: Total knee arthroplasty. Hardware appears intact and normally aligned. Postoperative changes to the patella. No definite joint effusion on cross-table lateral view. Calcified peripheral vascular disease. No acute osseous abnormality identified. IMPRESSION: Right knee total arthroplasty. No acute fracture or dislocation identified. Electronically Signed   By: Odessa Fleming M.D.   On: 05/12/2021 10:02   CT HEAD WO CONTRAST  Result Date: 05/11/2021 CLINICAL DATA:  Status post fall.  87 year old male. EXAM: CT HEAD WITHOUT CONTRAST TECHNIQUE: Contiguous axial images were obtained from the base of the skull through the vertex without intravenous contrast. COMPARISON:  August 02, 2020. FINDINGS: Brain: No evidence of acute infarction, hemorrhage, hydrocephalus, extra-axial collection or mass lesion/mass effect. Signs of atrophy and chronic microvascular ischemic change as before. Vascular: No hyperdense vessel or unexpected calcification. Skull: Normal. Negative for  fracture or focal lesion. Sinuses/Orbits: Visualized paranasal sinuses and orbits are unremarkable. Other: None IMPRESSION: No acute intracranial pathology. Signs of atrophy and chronic microvascular ischemic change as before. Electronically Signed   By: Donzetta Kohut M.D.   On: 05/11/2021 14:25   CT Cervical Spine Wo Contrast  Result Date: 05/11/2021 CLINICAL DATA:  Neck trauma in a 87 year old male. EXAM: CT CERVICAL SPINE WITHOUT CONTRAST TECHNIQUE: Multidetector CT imaging of the cervical spine was performed without intravenous contrast. Multiplanar CT image reconstructions were also generated. COMPARISON:  Comparison made with head exam of the same date and previous C-spine evaluation from August 02, 2020. FINDINGS: Alignment: Straightening of normal cervical lordotic curvature is similar to the prior study. Minimal anterolisthesis of C6 on C7 in the setting of degenerative changes is a finding that is unchanged. Skull base and vertebrae: No acute fracture. No primary bone lesion or focal pathologic process. Soft tissues and spinal canal: No prevertebral fluid or swelling. No visible canal hematoma. Disc levels: Multilevel degenerative changes similar to the prior study with marked atlantoaxial and atlantooccipital degenerative change as well as disc space narrowing, uncovertebral spurring and facet arthropathy. Upper chest: Negative. Other: None IMPRESSION: No acute fracture or dislocation in the cervical spine. Multilevel degenerative changes similar to the prior  study. Electronically Signed   By: Donzetta Kohut M.D.   On: 05/11/2021 14:14   MR BRAIN WO CONTRAST  Result Date: 05/11/2021 CLINICAL DATA:  Stroke suspected, right leg weakness EXAM: MRI HEAD WITHOUT CONTRAST TECHNIQUE: Multiplanar, multiecho pulse sequences of the brain and surrounding structures were obtained without intravenous contrast. COMPARISON:  No prior brain MRI, correlation is made with 05/11/2021 CT head FINDINGS: Evaluation  is somewhat limited as the patient not tolerate the exam, which is incomplete. Only diffusion-weighted imaging and a sagittal T1 sequence were obtained, the sagittal T1 being severely limited by motion artifact. Within this limitation, there is no restricted diffusion to suggest acute infarct. Cerebral volume loss, with commensurate size the ventricles and sulci. IMPRESSION: Limited exam, as the patient was unable to complete the study. On diffusion-weighted imaging, there is no evidence of acute infarct. Electronically Signed   By: Wiliam Ke M.D.   On: 05/11/2021 23:58   DG Chest Port 1 View  Result Date: 05/12/2021 CLINICAL DATA:  Fall, lethargy EXAM: PORTABLE CHEST 1 VIEW COMPARISON:  08/02/2020 FINDINGS: Lungs are clear.  No pleural effusion or pneumothorax. Heart is top-normal in size. IMPRESSION: No evidence of acute cardiopulmonary disease. Electronically Signed   By: Charline Bills M.D.   On: 05/12/2021 00:24   DG Humerus Right  Result Date: 05/11/2021 CLINICAL DATA:  Fall, pain EXAM: RIGHT HUMERUS - 2+ VIEW COMPARISON:  None. FINDINGS: No acute bony abnormality. Specifically, no fracture, subluxation, or dislocation. Degenerative changes in the shoulder. Soft tissues are intact. IMPRESSION: No acute bony abnormality. Electronically Signed   By: Charlett Nose M.D.   On: 05/11/2021 20:16   DG Hand Complete Right  Result Date: 05/11/2021 CLINICAL DATA:  Fall, pain EXAM: RIGHT HAND - COMPLETE 3+ VIEW COMPARISON:  None. FINDINGS: No acute bony abnormality. Specifically, no fracture, subluxation, or dislocation. Mild degenerative changes in the IP joints and wrist. IMPRESSION: No acute bony abnormality. Electronically Signed   By: Charlett Nose M.D.   On: 05/11/2021 20:15   DG HIP UNILAT WITH PELVIS 2-3 VIEWS RIGHT  Result Date: 05/12/2021 CLINICAL DATA:  Larey Seat yesterday.  Hip pain. EXAM: DG HIP (WITH OR WITHOUT PELVIS) 2-3V RIGHT COMPARISON:  None. FINDINGS: No evidence of pelvic or hip  fracture. Lower lumbar degenerative changes incidentally noted. IMPRESSION: No acute or traumatic finding. Electronically Signed   By: Paulina Fusi M.D.   On: 05/12/2021 10:01     Assessment and Recommendation  87 y.o. male with a past medical history of dementia, hyperlipidemia who presented with a mildly elevated troponin and new onset atrial fibrillation, not previously anticoagulated.  Currently with a good heart rate control.  Echocardiogram showing normal LV systolic function with an EF of 55 to 60%, normal RV systolic function, no significant valvular disease.  Plan: -Elevated troponin likely secondary to demand ischemia from chronic illness.   -Discontinue heparin and start oral Eliquis 5 mg BID for stroke risk reduction with atrial fibrillation -Patient currently has adequate rate control of atrial fibrillation with metoprolol 25 mg twice daily.  No changes to medical management at this time -Patient is currently stable from a cardiovascular standpoint, please follow-up as an outpatient in 1-2 weeks for ongoing management of atrial fibrillation. Please call with any future questions.   Signed, Maralyn Sago, PA-C

## 2021-05-13 NOTE — ED Notes (Signed)
Pt finally resting-wife requests we wait on meds

## 2021-05-13 NOTE — ED Notes (Signed)
Pt is asleep at this time. Pillows placed on the left side and under head.

## 2021-05-13 NOTE — Progress Notes (Signed)
ANTICOAGULATION CONSULT NOTE  Pharmacy Consult for heparin infusion Indication: afib   No Known Allergies  Patient Measurements: Height: 5\' 9"  (175.3 cm) Weight: 84.7 kg (186 lb 11.2 oz) IBW/kg (Calculated) : 70.7 Heparin Dosing Weight: 84.4 kg  Vital Signs: Temp: 98.3 F (36.8 C) (10/19 1754) Temp Source: Oral (10/19 1237) BP: 136/92 (10/19 1754) Pulse Rate: 91 (10/19 1754)  Labs: Recent Labs    05/11/21 1613 05/12/21 0150 05/12/21 0400 05/12/21 1345 05/12/21 1345 05/13/21 0018 05/13/21 0838 05/13/21 1730  HGB 14.5  --   --   --   --   --  13.1  --   HCT 42.4  --   --   --   --   --  37.8*  --   PLT 243  --   --   --   --   --  225  --   APTT  --   --  33  --   --   --   --   --   LABPROT  --   --  14.6  --   --   --   --   --   INR  --   --  1.1  --   --   --   --   --   HEPARINUNFRC  --   --   --  0.20*   < > 0.19* 0.60 0.57  CREATININE 0.79  --   --   --   --   --  0.71  --   CKTOTAL  --   --   --  396  --   --   --   --   TROPONINIHS 21* 78*  --  89*  --   --   --   --    < > = values in this interval not displayed.     Estimated Creatinine Clearance: 66.3 mL/min (by C-G formula based on SCr of 0.71 mg/dL).   Medical History: Past Medical History:  Diagnosis Date   Dementia [facility])     Assessment: 87yo male w/ dementia presenting to ED via EMS, after fall yesterday AM found with slightly elevated Troponin. The elevation is being attributed to demand ischemia from current illness. Pt was found to have new onset atrial fibrillation. Pharmacy was consulted for heparin.    Date/time HL  Interpretation/comment 10/18 1345 0.20 Subthera at 1100 units/hr 10/19 0018 0.19 Subthera at 1250 units/hr 10/19 0838 0.60 Thera x1 at 1500 units/hr 10/19 1730 0.57 Thera x2 at 1500 units/hr  Goal of Therapy:  Heparin level 0.3-0.7 units/ml Monitor platelets by anticoagulation protocol: Yes   Plan:  Heparin therapeutic, will continue current rate of 1500  units/hr Will recheck HL with daily w/ AM labs Continue to monitor daily CBC while on heparin ggt  11/19, PharmD Pharmacy Resident  05/13/2021 6:03 PM

## 2021-05-13 NOTE — Progress Notes (Signed)
*  PRELIMINARY RESULTS* Echocardiogram 2D Echocardiogram has been performed.  Cristela Blue 05/13/2021, 8:34 AM

## 2021-05-13 NOTE — ED Notes (Signed)
Breakfast tray delivered

## 2021-05-13 NOTE — Progress Notes (Signed)
ANTICOAGULATION CONSULT NOTE  Pharmacy Consult for heparin infusion Indication: afib   No Known Allergies  Patient Measurements: Height: 5\' 9"  (175.3 cm) Weight: 84.4 kg (186 lb) IBW/kg (Calculated) : 70.7 Heparin Dosing Weight: 84.4 kg  Vital Signs: Temp: 98.4 F (36.9 C) (10/19 0055) Temp Source: Oral (10/19 0055) BP: 134/74 (10/19 0055) Pulse Rate: 74 (10/19 0055)  Labs: Recent Labs    05/11/21 1613 05/12/21 0150 05/12/21 0400 05/12/21 1345 05/13/21 0018  HGB 14.5  --   --   --   --   HCT 42.4  --   --   --   --   PLT 243  --   --   --   --   APTT  --   --  33  --   --   LABPROT  --   --  14.6  --   --   INR  --   --  1.1  --   --   HEPARINUNFRC  --   --   --  0.20* 0.19*  CREATININE 0.79  --   --   --   --   CKTOTAL  --   --   --  396  --   TROPONINIHS 21* 78*  --  89*  --      Estimated Creatinine Clearance: 66.3 mL/min (by C-G formula based on SCr of 0.79 mg/dL).   Medical History: Past Medical History:  Diagnosis Date   Dementia [facility])     Assessment: 87yo male w/ dementia presenting to ED via EMS, after fall yesterday AM found with slightly elevated Troponin. The elevation is being attributed to demand ischemia from current illness. Pt was found to have new onset atrial fibrillation. Pharmacy was consulted for heparin.   Baseline CBC, PT/INR, and aPTT were within normal limits   Date/time HL  Interpretation/comment 10/18 1345 0.20 Subthera at 1100 units/hr 10/19 0018 0.19 subtherapeutic   Goal of Therapy:  Heparin level 0.3-0.7 units/ml Monitor platelets by anticoagulation protocol: Yes   Plan:  Give bolus of 2500 units x 1 Increase heparin infusion to 1500 units/hr Will recheck HL in 8hr after rate change Continue to monitor daily CBC while on heparin ggt  11/19, PharmD, [facility] 05/13/2021 1:05 AM

## 2021-05-13 NOTE — ED Notes (Signed)
Informed RN bed assigned 

## 2021-05-13 NOTE — Progress Notes (Signed)
ANTICOAGULATION CONSULT NOTE  Pharmacy Consult for heparin infusion Indication: afib   No Known Allergies  Patient Measurements: Height: 5\' 9"  (175.3 cm) Weight: 84.4 kg (186 lb) IBW/kg (Calculated) : 70.7 Heparin Dosing Weight: 84.4 kg  Vital Signs: Temp: 98.6 F (37 C) (10/19 0754) Temp Source: Axillary (10/19 0754) BP: 113/87 (10/19 0754) Pulse Rate: 76 (10/19 0754)  Labs: Recent Labs    05/11/21 1613 05/12/21 0150 05/12/21 0400 05/12/21 1345 05/13/21 0018 05/13/21 0838  HGB 14.5  --   --   --   --  13.1  HCT 42.4  --   --   --   --  37.8*  PLT 243  --   --   --   --  225  APTT  --   --  33  --   --   --   LABPROT  --   --  14.6  --   --   --   INR  --   --  1.1  --   --   --   HEPARINUNFRC  --   --   --  0.20* 0.19* 0.60  CREATININE 0.79  --   --   --   --  0.71  CKTOTAL  --   --   --  396  --   --   TROPONINIHS 21* 78*  --  89*  --   --      Estimated Creatinine Clearance: 66.3 mL/min (by C-G formula based on SCr of 0.71 mg/dL).   Medical History: Past Medical History:  Diagnosis Date   Dementia [facility])     Assessment: 87yo male w/ dementia presenting to ED via EMS, after fall yesterday AM found with slightly elevated Troponin. The elevation is being attributed to demand ischemia from current illness. Pt was found to have new onset atrial fibrillation. Pharmacy was consulted for heparin.    Date/time HL  Interpretation/comment 10/18 1345 0.20 Subthera at 1100 units/hr 10/19 0018 0.19 Subthera at 1250 units/hr 10/19 0838 0.60 Thera x1 at 1500 units/hr  Goal of Therapy:  Heparin level 0.3-0.7 units/ml Monitor platelets by anticoagulation protocol: Yes   Plan:  Heparin therapeutic, will continue current rate of 1500 units/hr Will recheck HL in 8hr to confirm therapeutic  Continue to monitor daily CBC while on heparin ggt  11/19, PharmD Pharmacy Resident  05/13/2021 9:59 AM

## 2021-05-13 NOTE — ED Notes (Signed)
Per lab, unable to get pt labs due to pt aggression

## 2021-05-13 NOTE — ED Notes (Signed)
This RN to bedside to assess pt, and update pt's spouse. Pt. Is sleeping, discussed POC with pt's spouse. She is wondering when pt. Will be switched to oral blood thinner. This RN reached out to Dr. Lucianne Muss to ascertain plan for pt. Will continue to monitor, and update family as able. NAD.

## 2021-05-13 NOTE — ED Notes (Signed)
Hospitalist at bedside 

## 2021-05-14 LAB — BASIC METABOLIC PANEL
Anion gap: 5 (ref 5–15)
BUN: 14 mg/dL (ref 8–23)
CO2: 26 mmol/L (ref 22–32)
Calcium: 9 mg/dL (ref 8.9–10.3)
Chloride: 104 mmol/L (ref 98–111)
Creatinine, Ser: 0.76 mg/dL (ref 0.61–1.24)
GFR, Estimated: >60 mL/min (ref >60)
Glucose, Bld: 122 mg/dL — ABNORMAL HIGH (ref 70–99)
Potassium: 3.3 mmol/L — ABNORMAL LOW (ref 3.5–5.1)
Sodium: 135 mmol/L (ref 135–145)

## 2021-05-14 LAB — CBC
HCT: 37.6 % — ABNORMAL LOW (ref 39.0–52.0)
Hemoglobin: 12.6 g/dL — ABNORMAL LOW (ref 13.0–17.0)
MCH: 31 pg (ref 26.0–34.0)
MCHC: 33.5 g/dL (ref 30.0–36.0)
MCV: 92.6 fL (ref 80.0–100.0)
Platelets: 235 10*3/uL (ref 150–400)
RBC: 4.06 MIL/uL — ABNORMAL LOW (ref 4.22–5.81)
RDW: 13.3 % (ref 11.5–15.5)
WBC: 9.9 10*3/uL (ref 4.0–10.5)
nRBC: 0 % (ref 0.0–0.2)

## 2021-05-14 LAB — TROPONIN I (HIGH SENSITIVITY): Troponin I (High Sensitivity): 27 ng/L — ABNORMAL HIGH

## 2021-05-14 LAB — PHOSPHORUS: Phosphorus: 2.7 mg/dL (ref 2.5–4.6)

## 2021-05-14 LAB — HEPARIN LEVEL (UNFRACTIONATED): Heparin Unfractionated: >1.1 [IU]/mL — ABNORMAL HIGH (ref 0.30–0.70)

## 2021-05-14 LAB — MAGNESIUM: Magnesium: 2 mg/dL (ref 1.7–2.4)

## 2021-05-14 MED ORDER — POTASSIUM CHLORIDE CRYS ER 20 MEQ PO TBCR
40.0000 meq | EXTENDED_RELEASE_TABLET | Freq: Once | ORAL | Status: AC
  Administered 2021-05-14: 40 meq via ORAL
  Filled 2021-05-14: qty 2

## 2021-05-14 NOTE — TOC Initial Note (Signed)
Transition of Care [facility]) - Initial/Assessment Note    Patient Details  Name: Stanley Sutton MRN: [id number] Date of Birth: 09/21/1931  Transition of Care [facility]) CM/SW Contact:    Gildardo Griffes, LCSW Phone Number: 05/14/2021, 2:11 PM  Clinical Narrative:                  CSW spoke with Harriett Sine at [facility], she requested clinicals be faxed to [phone]. She reports they will come to patient room to complete assessment and determine if patient can return to facility or needs snf rehab.   CSW spoke with patient's spouse Donnamarie Poag, above process explained. Should patient be unable to return to [facility] spouse has preference for [facility].   Pending [facility]'s assessment of patient to determine if he can return.  Expected Discharge Plan:  (to be determined)     Patient Goals and CMS Choice Patient states their goals for this hospitalization and ongoing recovery are:: to go home CMS Medicare.gov Compare Post Acute Care list provided to:: Patient Represenative (must comment) (spouse Donnamarie Poag) Choice offered to / list presented to : Spouse  Expected Discharge Plan and Services Expected Discharge Plan:  (to be determined)       Living arrangements for the past 2 months:  [facility])                                      Prior Living Arrangements/Services Living arrangements for the past 2 months:  ([facility]) Lives with:: Self, Facility Resident                   Activities of Daily Living Home Assistive Devices/Equipment: Nurse, adult, Hospital bed ADL Screening (condition at time of admission) Patient's cognitive ability adequate to safely complete daily activities?: No Is the patient deaf or have difficulty hearing?: No Does the patient have difficulty seeing, even when wearing glasses/contacts?: No Does the patient have difficulty concentrating, remembering, or making decisions?: Yes Patient able to express need for assistance with ADLs?:  No Does the patient have difficulty dressing or bathing?: Yes Independently performs ADLs?: No Communication: Independent Is this a change from baseline?: Pre-admission baseline Dressing (OT): Dependent Is this a change from baseline?: Pre-admission baseline Grooming: Dependent Is this a change from baseline?: Pre-admission baseline Feeding: Dependent Is this a change from baseline?: Pre-admission baseline Bathing: Dependent Is this a change from baseline?: Pre-admission baseline Toileting: Dependent Is this a change from baseline?: Pre-admission baseline In/Out Bed: Dependent Is this a change from baseline?: Pre-admission baseline Walks in Home: Dependent Is this a change from baseline?: Pre-admission baseline Does the patient have difficulty walking or climbing stairs?: Yes Weakness of Legs: Both Weakness of Arms/Hands: Both  Permission Sought/Granted                  Emotional Assessment       Orientation: : Oriented to Self Alcohol / Substance Use: Not Applicable Psych Involvement: No (comment)  Admission diagnosis:  Pain [R52] Fall [W19.XXXA] NSTEMI (non-ST elevated myocardial infarction) (HCC) [I21.4] Fall, initial encounter [W19.XXXA] Arm contusion, right, initial encounter [S40.021A] Atrial fibrillation, unspecified type (HCC) [I48.91] Alzheimer's dementia, unspecified dementia severity, unspecified timing of dementia onset, unspecified whether behavioral, psychotic, or mood disturbance or anxiety (HCC) [G30.9, F02.80] Patient Active Problem List   Diagnosis Date Noted   NSTEMI (non-ST elevated myocardial infarction) (HCC) 05/12/2021   Dementia without behavioral  disturbance (HCC) 05/12/2021   Fall 05/12/2021   Unspecified atrial fibrillation (HCC) 05/12/2021   PCP:  [facility]:   [facility] [address], [place] - [address] [place] [address] Phone: [phone] Fax: [phone]     Social  Determinants of Health (SDOH) Interventions    Readmission Risk Interventions No flowsheet data found.

## 2021-05-14 NOTE — [place] FL2 (Signed)
[place] MEDICAID FL2 LEVEL OF CARE SCREENING TOOL     IDENTIFICATION  Patient Name: Stanley Sutton Birthdate: 1931-11-22 Sex: male Admission Date (Current Location): 05/12/2021  [facility] and [place] Number:  Chiropodist and Address:  [facility], [address], [place], [place] [address]      Provider Number: [id number]  Attending Physician Name and Address:  Cipriano Bunker, MD  Relative Name and Phone Number:  Donnamarie Poag (spouse)    [phone]    Current Level of Care: Hospital Recommended Level of Care: Memory Care Prior Approval Number:    Date Approved/Denied:   PASRR Number: [id number]  Discharge Plan: Other (Comment) (return to Memory Care)    Current Diagnoses: Patient Active Problem List   Diagnosis Date Noted   NSTEMI (non-ST elevated myocardial infarction) (HCC) 05/12/2021   Dementia without behavioral disturbance (HCC) 05/12/2021   Fall 05/12/2021   Unspecified atrial fibrillation (HCC) 05/12/2021    Orientation RESPIRATION BLADDER Height & Weight     Self  Normal Incontinent, External catheter Weight: 186 lb 11.2 oz (84.7 kg) Height:  5\' 9"  (175.3 cm)  BEHAVIORAL SYMPTOMS/MOOD NEUROLOGICAL BOWEL NUTRITION STATUS      Incontinent Diet (see discharge summary)  AMBULATORY STATUS COMMUNICATION OF NEEDS Skin   Limited Assist Verbally Normal                       Personal Care Assistance Level of Assistance  Bathing, Dressing, Feeding, Total care Bathing Assistance: Limited assistance Feeding assistance: Independent Dressing Assistance: Limited assistance Total Care Assistance: Limited assistance   Functional Limitations Info  Sight, Hearing, Speech Sight Info: Adequate Hearing Info: Adequate Speech Info: Adequate    SPECIAL CARE FACTORS FREQUENCY                       Contractures Contractures Info: Not present    Additional Factors Info  Code Status, Allergies Code Status Info:  DNR Allergies Info: No Known Allergies           Current Medications (05/14/2021):  This is the current hospital active medication list Current Facility-Administered Medications  Medication Dose Route Frequency Provider Last Rate Last Admin   acetaminophen (TYLENOL) tablet 650 mg  650 mg Oral Q4H PRN Agbata, Tochukwu, MD   650 mg at 05/13/21 0131   ammonium lactate (LAC-HYDRIN) 12 % lotion   Topical Daily Agbata, Tochukwu, MD   Given at 05/13/21 1017   apixaban (ELIQUIS) tablet 5 mg  5 mg Oral BID 05/15/21, PA-C   5 mg at 05/14/21 05/16/21   fentaNYL (SUBLIMAZE) injection 12.5 mcg  12.5 mcg Intravenous Once 2423, MD       LORazepam (ATIVAN) tablet 0.5 mg  0.5 mg Oral TID PRN Agbata, Tochukwu, MD   0.5 mg at 05/12/21 2017   LORazepam (ATIVAN) tablet 0.5 mg  0.5 mg Oral Once 2018, MD       metoprolol tartrate (LOPRESSOR) tablet 25 mg  25 mg Oral BID Agbata, Tochukwu, MD   25 mg at 05/14/21 0959   mirtazapine (REMERON) tablet 15 mg  15 mg Oral QHS Agbata, Tochukwu, MD   15 mg at 05/13/21 2141   ondansetron (ZOFRAN) injection 4 mg  4 mg Intravenous Q6H PRN Agbata, Tochukwu, MD       pantoprazole (PROTONIX) EC tablet 40 mg  40 mg Oral Daily Agbata, Tochukwu, MD   40 mg at 05/14/21 0959   risperiDONE (RISPERDAL) tablet  0.5 mg  0.5 mg Oral QHS Lindajo Royal V, MD   0.5 mg at 05/13/21 2141   simvastatin (ZOCOR) tablet 20 mg  20 mg Oral Daily Andris Baumann, MD   20 mg at 05/13/21 2141   traZODone (DESYREL) tablet 50 mg  50 mg Oral QHS Andris Baumann, MD   50 mg at 05/13/21 2141     Discharge Medications: Please see discharge summary for a list of discharge medications.  Relevant Imaging Results:  Relevant Lab Results:   Additional Information SSN: [id number]  Gildardo Griffes, LCSW

## 2021-05-14 NOTE — Progress Notes (Signed)
PROGRESS NOTE    Stanley Sutton  HUT:[id number] DOB: Apr 07, 1932 DOA: 05/12/2021 PCP: [facility]    Brief Narrative:  This 87 years old male with PMH significant for dementia, dyslipidemia, ischemic optic neuropathy presented in the ED for the evaluation of right-sided weakness following a fall.  Patient's wife reports that she received a call from memory care unit that he has fallen, after she arrived she noticed he was unable to use his right side and unable to bear weight on his right leg.  Patient was last seen normal by his wife a week ago. CT head without any acute intracranial pathology.  CT C-spine no fracture or dislocation.  Patient had extensive imaging of right hand which is negative.  MRI brain without contrast: No evidence of acute infarct.  EKG shows A. fib.   Patient was admitted for A. fib with RVR and started on IV heparin. Cardiology is consulted.  Patient's heart rate remains controlled with metoprolol.  Patient is started on Eliquis.  Heparin discontinued.  Patient is medically cleared to be discharged awaiting return back to memory care unit.  Assessment & Plan:   Principal Problem:   Unspecified atrial fibrillation (HCC) Active Problems:   NSTEMI (non-ST elevated myocardial infarction) (HCC)   Dementia without behavioral disturbance (HCC)   Fall   Atrial fibrillation with RVR.   It appears to be new onset. Heart rate remains controlled. Continue metoprolol 25 mg twice daily for rate control. Started on heparin and then transitioned to Eliquis. 2D echocardiogram shows LVEF 55 to 60%, normal LV function.  No RWMA. Cardiology signed off.  Recommended outpatient follow-up.  Elevated troponin: This could be secondary to demand ischemia in the setting of A. fib with RVR Continue metoprolol.   Status post Fall: Patient with right arm pain and weakness, inability to weight on right leg Patient had extensive imaging of the right arm which is  negative Continue fall precautions. Right knee and right hip x-ray no fracture or dislocation.  Dementia: Stable remains at baseline.  Depression and anxiety: Continue Remeron and lorazepam    DVT prophylaxis: Heparin GTT Code Status: DNR Family Communication: Spouse at bedside Disposition Plan:    Status is: Inpatient  Remains inpatient appropriate because: New onset A. fib, work-up and management.  Anticipated discharge back to memory care unit tomorrow.   Consultants:  Cardiology  Procedures: Echocardiogram Antimicrobials: None  Subjective: Patient was seen and examined at bedside.  Overnight events noted.   Patient feels much improved.  Denies any chest pain or shortness of breath. Heart rate is well controlled and started on Eliquis.  Objective: Vitals:   05/13/21 2327 05/14/21 0221 05/14/21 0729 05/14/21 1134  BP: (!) 146/125 (!) 113/52 124/78 (!) 116/58  Pulse: 88 78 75 66  Resp: 18 17 17 17   Temp: 98.2 F (36.8 C) 98.6 F (37 C) 97.9 F (36.6 C) 98 F (36.7 C)  TempSrc:      SpO2: 100% 91% 98% 94%  Weight:      Height:        Intake/Output Summary (Last 24 hours) at 05/14/2021 1515 Last data filed at 05/14/2021 1239 Gross per 24 hour  Intake 237.94 ml  Output 1600 ml  Net -1362.06 ml   Filed Weights   05/11/21 1301 05/13/21 1754  Weight: 84.4 kg 84.7 kg    Examination:  General exam: Appears comfortable, not in any acute distress, deconditioned.   Respiratory system: Clear to auscultation. Respiratory effort normal. Cardiovascular  system: S1-S2 heard, irregular rhythm, no murmur.   Gastrointestinal system: Abdomen is soft, nontender, nondistended, BS + Central nervous system: Alert and oriented. No focal neurological deficits. Extremities: No edema, no clubbing, no cyanosis. Skin: No rashes, lesions or ulcers Psychiatry:Mood & affect appropriate.     Data Reviewed: I have personally reviewed following labs and imaging  studies  CBC: Recent Labs  Lab 05/11/21 1613 05/13/21 0838 05/14/21 0603  WBC 11.2* 11.2* 9.9  HGB 14.5 13.1 12.6*  HCT 42.4 37.8* 37.6*  MCV 94.4 92.4 92.6  PLT 243 225 235   Basic Metabolic Panel: Recent Labs  Lab 05/11/21 1613 05/13/21 0838 05/14/21 0603  NA 136 136 135  K 3.4* 3.4* 3.3*  CL 98 105 104  CO2 25 22 26   GLUCOSE 165* 119* 122*  BUN 14 14 14   CREATININE 0.79 0.71 0.76  CALCIUM 9.9 9.4 9.0  MG  --   --  2.0  PHOS  --   --  2.7   GFR: Estimated Creatinine Clearance: 66.3 mL/min (by C-G formula based on SCr of 0.76 mg/dL). Liver Function Tests: No results for input(s): AST, ALT, ALKPHOS, BILITOT, PROT, ALBUMIN in the last 168 hours. No results for input(s): LIPASE, AMYLASE in the last 168 hours. No results for input(s): AMMONIA in the last 168 hours. Coagulation Profile: Recent Labs  Lab 05/12/21 0400  INR 1.1   Cardiac Enzymes: Recent Labs  Lab 05/12/21 1345  CKTOTAL 396   BNP (last 3 results) No results for input(s): PROBNP in the last 8760 hours. HbA1C: No results for input(s): HGBA1C in the last 72 hours. CBG: No results for input(s): GLUCAP in the last 168 hours. Lipid Profile: Recent Labs    05/13/21 0838  CHOL 105  HDL 48  LDLCALC 47  TRIG 49  CHOLHDL 2.2   Thyroid Function Tests: Recent Labs    05/12/21 1345  TSH 0.683   Anemia Panel: No results for input(s): VITAMINB12, FOLATE, FERRITIN, TIBC, IRON, RETICCTPCT in the last 72 hours. Sepsis Labs: No results for input(s): PROCALCITON, LATICACIDVEN in the last 168 hours.  Recent Results (from the past 240 hour(s))  Resp Panel by RT-PCR (Flu A&B, Covid) Nasopharyngeal Swab     Status: None   Collection Time: 05/13/21  7:53 AM   Specimen: Nasopharyngeal Swab; Nasopharyngeal(NP) swabs in vial transport medium  Result Value Ref Range Status   SARS Coronavirus 2 by RT PCR NEGATIVE NEGATIVE Final    Comment: (NOTE) SARS-CoV-2 target nucleic acids are NOT DETECTED.  The  SARS-CoV-2 RNA is generally detectable in upper respiratory specimens during the acute phase of infection. The lowest concentration of SARS-CoV-2 viral copies this assay can detect is 138 copies/mL. A negative result does not preclude SARS-Cov-2 infection and should not be used as the sole basis for treatment or other patient management decisions. A negative result may occur with  improper specimen collection/handling, submission of specimen other than nasopharyngeal swab, presence of viral mutation(s) within the areas targeted by this assay, and inadequate number of viral copies(<138 copies/mL). A negative result must be combined with clinical observations, patient history, and epidemiological information. The expected result is Negative.  Fact Sheet for Patients:  05/14/21  Fact Sheet for Healthcare Providers:  05/15/21  This test is no t yet approved or cleared by the [employer] FDA and  has been authorized for detection and/or diagnosis of SARS-CoV-2 by FDA under an Emergency Use Authorization (EUA). This EUA will remain  in effect (meaning this  test can be used) for the duration of the COVID-19 declaration under Section 564(b)(1) of the Act, 21 U.S.C.section 360bbb-3(b)(1), unless the authorization is terminated  or revoked sooner.       Influenza A by PCR NEGATIVE NEGATIVE Final   Influenza B by PCR NEGATIVE NEGATIVE Final    Comment: (NOTE) The Xpert Xpress SARS-CoV-2/FLU/RSV plus assay is intended as an aid in the diagnosis of influenza from Nasopharyngeal swab specimens and should not be used as a sole basis for treatment. Nasal washings and aspirates are unacceptable for Xpert Xpress SARS-CoV-2/FLU/RSV testing.  Fact Sheet for Patients: [employer]  Fact Sheet for Healthcare Providers: [employer]it  This test is not yet approved or  cleared by the [place] FDA and has been authorized for detection and/or diagnosis of SARS-CoV-2 by FDA under an Emergency Use Authorization (EUA). This EUA will remain in effect (meaning this test can be used) for the duration of the COVID-19 declaration under Section 564(b)(1) of the Act, 21 U.S.C. section 360bbb-3(b)(1), unless the authorization is terminated or revoked.  Performed at [facility], [address] [place], [place] [address]     Radiology Studies: ECHOCARDIOGRAM COMPLETE  Result Date: 05/13/2021    ECHOCARDIOGRAM REPORT   Patient Name:   Stanley Sutton Date of Exam: 05/13/2021 Medical Rec #:  [id number]        Height:       69.0 in Accession #:    [id number]       Weight:       186.0 lb Date of Birth:  Nov 03, 1931       BSA:          2.003 m Patient Age:    88 years         BP:           113/87 mmHg Patient Gender: M                HR:           76 bpm. Exam Location:  [facility] Procedure: 2D Echo, Cardiac Doppler and Color Doppler Indications:     Atrial Fibrillation I48.91  History:         Patient has no prior history of Echocardiogram examinations.                  Dementia.  Sonographer:     Cristela Blue Referring Phys:  [id number] AGBATA Diagnosing Phys: Arnoldo Hooker MD  Sonographer Comments: Technically challenging study due to limited acoustic windows and no parasternal window. IMPRESSIONS  1. Left ventricular ejection fraction, by estimation, is 55 to 60%. The left ventricle has normal function. The left ventricle has no regional wall motion abnormalities. Left ventricular diastolic parameters were normal.  2. Right ventricular systolic function is normal. The right ventricular size is normal.  3. The mitral valve is normal in structure. Mild mitral valve regurgitation.  4. The aortic valve is normal in structure. Aortic valve regurgitation is not visualized. FINDINGS  Left Ventricle: Left ventricular ejection fraction, by estimation, is 55 to 60%. The left  ventricle has normal function. The left ventricle has no regional wall motion abnormalities. The left ventricular internal cavity size was normal in size. There is  no left ventricular hypertrophy. Left ventricular diastolic parameters were normal. Right Ventricle: The right ventricular size is normal. No increase in right ventricular wall thickness. Right ventricular systolic function is normal. Left Atrium: Left atrial size was normal in size. Right Atrium: Right atrial size  was normal in size. Pericardium: There is no evidence of pericardial effusion. Mitral Valve: The mitral valve is normal in structure. Mild mitral valve regurgitation. Tricuspid Valve: The tricuspid valve is normal in structure. Tricuspid valve regurgitation is trivial. Aortic Valve: The aortic valve is normal in structure. Aortic valve regurgitation is not visualized. Aortic valve mean gradient measures 3.7 mmHg. Aortic valve peak gradient measures 6.8 mmHg. Aortic valve area, by VTI measures 2.03 cm. Pulmonic Valve: The pulmonic valve was normal in structure. Pulmonic valve regurgitation is not visualized. Aorta: The aortic root and ascending aorta are structurally normal, with no evidence of dilitation. IAS/Shunts: No atrial level shunt detected by color flow Doppler.  LEFT VENTRICLE PLAX 2D LVIDd:         4.09 cm   Diastology LVIDs:         2.71 cm   LV e' medial:    6.09 cm/s LV PW:         0.97 cm   LV E/e' medial:  15.2 LV IVS:        0.77 cm   LV e' lateral:   7.07 cm/s LVOT diam:     2.00 cm   LV E/e' lateral: 13.1 LV SV:         52 LV SV Index:   26 LVOT Area:     3.14 cm  RIGHT VENTRICLE RV Basal diam:  3.24 cm RV S prime:     12.40 cm/s TAPSE (M-mode): 3.8 cm LEFT ATRIUM             Index        RIGHT ATRIUM           Index LA diam:        3.10 cm 1.55 cm/m   RA Area:     11.10 cm LA Vol (A2C):   24.6 ml 12.28 ml/m  RA Volume:   26.00 ml  12.98 ml/m LA Vol (A4C):   31.0 ml 15.47 ml/m LA Biplane Vol: 30.4 ml 15.18 ml/m   AORTIC VALVE AV Area (Vmax):    1.87 cm AV Area (Vmean):   1.96 cm AV Area (VTI):     2.03 cm AV Vmax:           130.33 cm/s AV Vmean:          88.033 cm/s AV VTI:            0.256 m AV Peak Grad:      6.8 mmHg AV Mean Grad:      3.7 mmHg LVOT Vmax:         77.70 cm/s LVOT Vmean:        54.900 cm/s LVOT VTI:          0.165 m LVOT/AV VTI ratio: 0.65  AORTA Ao Root diam: 3.20 cm MITRAL VALVE               TRICUSPID VALVE MV Area (PHT): 3.11 cm    TR Peak grad:   8.3 mmHg MV Decel Time: 244 msec    TR Vmax:        144.00 cm/s MV E velocity: 92.50 cm/s MV A velocity: 96.40 cm/s  SHUNTS MV E/A ratio:  0.96        Systemic VTI:  0.16 m                            Systemic Diam: 2.00 cm Arnoldo Hooker  MD Electronically signed by Arnoldo Hooker MD Signature Date/Time: 05/13/2021/1:31:58 PM    Final      Scheduled Meds:  ammonium lactate   Topical Daily   apixaban  5 mg Oral BID   fentaNYL (SUBLIMAZE) injection  12.5 mcg Intravenous Once   LORazepam  0.5 mg Oral Once   metoprolol tartrate  25 mg Oral BID   mirtazapine  15 mg Oral QHS   pantoprazole  40 mg Oral Daily   potassium chloride  40 mEq Oral Once   risperiDONE  0.5 mg Oral QHS   simvastatin  20 mg Oral Daily   traZODone  50 mg Oral QHS   Continuous Infusions:     LOS: 2 days    Time spent: 25 mins    Cipriano Bunker, MD [facility]   If 7PM-7AM, please contact night-coverage

## 2021-05-14 NOTE — Evaluation (Signed)
Occupational Therapy Evaluation Patient Details Name: Stanley Sutton MRN: [id number] DOB: 1932-07-05 Today's Date: 05/14/2021   History of Present Illness Patient ia a 87 year old male with PMH significant for dementia, dyslipidemia, ischemic optic neuropathy presented in the ED for the evaluation of right-sided weakness following a fall. CT head without any acute intracranial pathology. Right hand imaging negative. Right knee and right hip x-ray no fracture or dislocation. MRI brain with no evidence of acute infarct. Found to have atrial fibrillation with RVR.   Clinical Impression   Pt seen for OT evaluation this fate in setting of acute hospitalization d/t Afib with RVR. Pt presents this date with some increased confusion, especially in terms of motor planning as his spouse reports that he was dancing last week. Pt lives in ALF with assist for all IADLs and some ADLs including bathing. Pt currently requiring MOD/MAX A for transfers, primarily d/t decreased motor planning. Pt is also somewhat weak and is noted to have decreased tolerance for R UE ROM despite negative imaging. Pt requires MOD A for UB dressing when assessed this date and is transferred to commode as he is requesting to have BM. Pt and spouse requesting increased time and spouse offers to stay seated next to patient for safety. CNA made aware of pt left status and transfer status. Pt will require f/u HHOT services to restore strength for safe ADLs and ADL mobility in the natural environment.      Recommendations for follow up therapy are one component of a multi-disciplinary discharge planning process, led by the attending physician.  Recommendations may be updated based on patient status, additional functional criteria and insurance authorization.   Follow Up Recommendations  Home health OT (HH to f/u in pt's memory care setting)    Equipment Recommendations  3 in 1 bedside commode    Recommendations for Other Services        Precautions / Restrictions Precautions Precautions: Fall Restrictions Weight Bearing Restrictions: No      Mobility Bed Mobility Overal bed mobility: Needs Assistance Bed Mobility: Supine to Sit;Sit to Supine     Supine to sit: Mod assist     General bed mobility comments: increased time, use of bed rails    Transfers Overall transfer level: Needs assistance Equipment used: Rolling walker (2 wheeled);1 person hand held assist Transfers: Stand Pivot Transfers;Sit to/from Stand Sit to Stand: Min assist Stand pivot transfers: Mod assist;Max assist       General transfer comment: increased time and assistance, pt is motivated by wanting to use BSC to have BM. He is physically able to transition to standing pretty well, but the difficulty is with sequencing weight shift/shuffling feet to pivot to [facility], responds best to tactile cues.    Balance Overall balance assessment: Needs assistance Sitting-balance support: Feet supported;Single extremity supported Sitting balance-Leahy Scale: Fair Sitting balance - Comments: occasional left lateral lean in sitting position Postural control: Left lateral lean   Standing balance-Leahy Scale: Poor Standing balance comment: requires UE support and MIN A to sustain static stand, mostly limited by decreased motor planning (ex: has hips flexed until OT tactile cues pt to extend, then he can pretty much maintain static balance with CGA/SUPV).                           ADL either performed or assessed with clinical judgement   ADL  General ADL Comments: requires MIN A for all seated UB ADLs at least (even for tasks which he usually only requires SETUP such as self-feeding). Requires MOD/MAX A for all LB ADLs in sitting. Requires MAX A for ADL transfers including to [facility], mostly appears d/t difficulty with sequencing and decreased motor planning.     Vision Patient  Visual Report: No change from baseline Additional Comments: difficult to formally assess d/t cognition, pt does appear to appropriately track when eyes open, but noted to have minimal eye opening until significantly stimulated to participate in session. some low vision at baseline d/t optic neuropathy     Perception     Praxis      Pertinent Vitals/Pain Pain Assessment: Faces Faces Pain Scale: Hurts little more Pain Location: R UE with any attempt to mobilize Pain Descriptors / Indicators: Guarding;Grimacing Pain Intervention(s): Limited activity within patient's tolerance;Monitored during session;Repositioned     Hand Dominance Right   Extremity/Trunk Assessment Upper Extremity Assessment Upper Extremity Assessment: Generalized weakness;RUE deficits/detail (full ROM with L UE.) RUE Deficits / Details: shld flexion to ~100 degrees, but with limited tolerance to ROM d/t pain AEB grimmacing. grip MMT grossly 4-/5, difficult to formally assess d/t cognition   Lower Extremity Assessment Lower Extremity Assessment: Difficult to assess due to impaired cognition;Generalized weakness;Defer to PT evaluation       Communication Communication Communication: No difficulties   Cognition Arousal/Alertness: Lethargic Behavior During Therapy: WFL for tasks assessed/performed Overall Cognitive Status: History of cognitive impairments - at baseline                                 General Comments: patient is oriented to person only. he knows who is wife is in the room. patient is able to follow commands with increased time and multi-modal cueing   General Comments       Exercises Other Exercises Other Exercises: OT engages pt's spouse in ed re: tactile cues versus verbal cues to help pt sequence a task   Shoulder Instructions      Home Living Family/patient expects to be discharged to:: Assisted living                             Home Equipment: Walker - 4  wheels   Additional Comments: Patient lives at [facility] ALF in the memory care unit. He particpates in activities including Zumba. Spouse showed a video of him dancing that was filmed just last week.      Prior Functioning/Environment Level of Independence: Needs assistance;Independent with assistive device(s)  Gait / Transfers Assistance Needed: patient is independent with mobility and ambulation using a 4 wheeled walker at ALF ADL's / Homemaking Assistance Needed: assistance needed for ADLs likely due to cognitive issues at baseline, he is able to get to restroom and toilet, but is losing recognition of needing to go. Spouse and ALF assist with bathing/dressing, but he is usually able to contribute Communication / Swallowing Assistance Needed: patient usually feeds himself Comments: he has assistance with medication management at ALF        OT Problem List: Decreased strength;Decreased activity tolerance;Impaired balance (sitting and/or standing);Decreased cognition;Decreased safety awareness;Decreased knowledge of use of DME or AE;Decreased coordination;Impaired UE functional use      OT Treatment/Interventions: Self-care/ADL training;Therapeutic exercise;DME and/or AE instruction;Therapeutic activities    OT Goals(Current goals can be found in the care plan  section) Acute Rehab OT Goals Patient Stated Goal: to go back to ALF (spouse goal) OT Goal Formulation: With family Time For Goal Achievement: 05/28/21 Potential to Achieve Goals: Fair ADL Goals Pt Will Transfer to Toilet: with supervision;ambulating;grab bars (LRAD) Pt Will Perform Toileting - Clothing Manipulation and hygiene: with supervision;sitting/lateral leans  OT Frequency: Min 1X/week   Barriers to D/C:            Co-evaluation              AM-PAC OT "6 Clicks" Daily Activity     Outcome Measure Help from another person eating meals?: A Little Help from another person taking care of personal grooming?:  A Little Help from another person toileting, which includes using toliet, bedpan, or urinal?: A Lot Help from another person bathing (including washing, rinsing, drying)?: A Lot Help from another person to put on and taking off regular upper body clothing?: A Little Help from another person to put on and taking off regular lower body clothing?: A Lot 6 Click Score: 15   End of Session Equipment Utilized During Treatment: Gait belt;Rolling walker Nurse Communication: Mobility status;Other (comment) (cognitive status and it's effect on mobility although pt has decent functional strength)  Activity Tolerance: Patient tolerated treatment well Patient left: Other (comment) (seated on BSC with CNA made aware and pt's spouse sitting right next to him to ensure safety while pt sits to attempt to have BM)  OT Visit Diagnosis: Other abnormalities of gait and mobility (R26.89);Muscle weakness (generalized) (M62.81);Other symptoms and signs involving cognitive function;Adult, failure to thrive (R62.7)                Time: 1132-1203 OT Time Calculation (min): 31 min Charges:  OT General Charges $OT Visit: 1 Visit OT Evaluation $OT Eval Moderate Complexity: 1 Mod OT Treatments $Self Care/Home Management : 8-22 mins  Rejeana Brock, MS, OTR/L ascom [phone] 05/14/21, 6:20 PM

## 2021-05-14 NOTE — TOC Progression Note (Signed)
Transition of Care [facility]) - Progression Note    Patient Details  Name: Altan Kraai MRN: [id number] Date of Birth: May 09, 1932  Transition of Care [facility]) CM/SW Contact  Gildardo Griffes, [place] Phone Number: 05/14/2021, 4:04 PM  Clinical Narrative:     Harriett Sine at [facility] reports they should be coming now to do patient's assessment to determine if she can return. At this point in the day, will plan for discharge tomorrow to appropriate venue based on assessment. MD aware.   Expected Discharge Plan:  (to be determined)    Expected Discharge Plan and Services Expected Discharge Plan:  (to be determined)       Living arrangements for the past 2 months:  [facility])                                       Social Determinants of Health (SDOH) Interventions    Readmission Risk Interventions No flowsheet data found.

## 2021-05-14 NOTE — Evaluation (Signed)
Physical Therapy Evaluation Patient Details Name: Stanley Sutton MRN: [id number] DOB: 1931-07-31 Today's Date: 05/14/2021  History of Present Illness  Patient ia a 87 year old male with PMH significant for dementia, dyslipidemia, ischemic optic neuropathy presented in the ED for the evaluation of right-sided weakness following a fall. CT head without any acute intracranial pathology. Right hand imaging negative. Right knee and right hip x-ray no fracture or dislocation. MRI brain with no evidence of acute infarct. Found to have atrial fibrillation with RVR.  Clinical Impression  Patient sleeping on arrival to the room. Supportive spouse at the bedside provided all prior level of function information. Patient is from [facility] ALF and resides in the memory care unit. He is independent with ambulation and mobility at baseline with a 4 wheeled walker and has assistance and supervision for safety with ADLs due to baseline dementia.  Today patient is able to follow commands with multi-modal cueing. He required assistance with bed mobility with fair sitting balance. He is actively moving all extremities with mild grimacing with movement of right hand (although he denies pain). Attempted to help patient to stand, however he does not actively participate with standing efforts due to fatigue with activity vs cognition.  He is not at his baseline level of functional mobility. However, the spouse wants him to discharge back to ALF where he resides. Patient will need supervision for safety with all mobility and ongoing physical therapy at discharge. Recommend to return to ALF with increased staff support or SNF will be required.      Recommendations for follow up therapy are one component of a multi-disciplinary discharge planning process, led by the attending physician.  Recommendations may be updated based on patient status, additional functional criteria and insurance authorization.  Follow Up  Recommendations Supervision for mobility/OOB (back to ALF vs SNF)    Equipment Recommendations  Other (comment) (to be determined at next level of care)    Recommendations for Other Services       Precautions / Restrictions Precautions Precautions: Fall Restrictions Weight Bearing Restrictions: No      Mobility  Bed Mobility Overal bed mobility: Needs Assistance Bed Mobility: Supine to Sit;Sit to Supine     Supine to sit: Mod assist Sit to supine: Max assist   General bed mobility comments: assistance for trunk support to sit upright. assistance for LE and trunk support to return to bed. increased time and effort requried with multi-modal cueing.    Transfers                 General transfer comment: attempted to have patient perform sit to stand transfer with tactile and verbal cueing for task initiation. patient does not initiate standing despite encouragement.  Ambulation/Gait                Stairs            Wheelchair Mobility    Modified Rankin (Stroke Patients Only)       Balance Overall balance assessment: Needs assistance Sitting-balance support: Feet supported;Single extremity supported Sitting balance-Leahy Scale: Fair Sitting balance - Comments: occasional left lateral lean in sitting position Postural control: Left lateral lean                                   Pertinent Vitals/Pain Pain Assessment: No/denies pain (mild grimicing with using right hand, states he has no pain)    Home  Living Family/patient expects to be discharged to:: Assisted living               Home Equipment: Dan Humphreys - 4 wheels Additional Comments: Patient lives at [facility] ALF in the memory care unit. He particpates in activities including Zumba. Spouse showed a video of him dancing that was filmed just last week.    Prior Function Level of Independence: Needs assistance;Independent with assistive device(s)   Gait / Transfers  Assistance Needed: patient is independent with mobility and ambulation using a 4 wheeled walker at ALF  ADL's / Homemaking Assistance Needed: assistance needed for ADLs likely due to cognitive issues at baseline  Comments: he has assistance with medication management at ALF     Hand Dominance   Dominant Hand: Right    Extremity/Trunk Assessment   Upper Extremity Assessment Upper Extremity Assessment: RUE deficits/detail;Generalized weakness (LUE generalized weakness, AROM shoulder flexion to 90 degrees) RUE Deficits / Details: active shoulder flexion limited to ~ 80 degrees. limited AROM observed overall with functional activity compared to left arm. patient unable to follow commands for formal MMT    Lower Extremity Assessment Lower Extremity Assessment: Generalized weakness;Difficult to assess due to impaired cognition (unable to follow commands for formal MMT)       Communication   Communication: No difficulties  Cognition Arousal/Alertness: Lethargic Behavior During Therapy: WFL for tasks assessed/performed Overall Cognitive Status: History of cognitive impairments - at baseline                                 General Comments: patient is oriented to person only. he knows who is wife is in the room. patient is able to follow commands with increased time and multi-modal cueing      General Comments General comments (skin integrity, edema, etc.): educated spouse on importance of positioning of RUE with pillow for support. heart rate 88bpm with activity in sitting, Sp02 95% on room air    Exercises     Assessment/Plan    PT Assessment Patient needs continued PT services  PT Problem List Decreased strength;Decreased range of motion;Decreased activity tolerance;Decreased balance;Decreased mobility;Decreased cognition;Decreased knowledge of use of DME;Decreased safety awareness;Decreased knowledge of precautions       PT Treatment Interventions DME  instruction;Gait training;Functional mobility training;Therapeutic exercise;Therapeutic activities;Neuromuscular re-education;Balance training;Cognitive remediation;Patient/family education;Wheelchair mobility training    PT Goals (Current goals can be found in the Care Plan section)  Acute Rehab PT Goals Patient Stated Goal: to go back to ALF (spouse goal) PT Goal Formulation: With family Time For Goal Achievement: 05/28/21 Potential to Achieve Goals: Fair    Frequency Min 2X/week   Barriers to discharge        Co-evaluation               AM-PAC PT "6 Clicks" Mobility  Outcome Measure Help needed turning from your back to your side while in a flat bed without using bedrails?: A Lot Help needed moving from lying on your back to sitting on the side of a flat bed without using bedrails?: A Lot Help needed moving to and from a bed to a chair (including a wheelchair)?: A Lot Help needed standing up from a chair using your arms (e.g., wheelchair or bedside chair)?: A Lot Help needed to walk in hospital room?: A Lot Help needed climbing 3-5 steps with a railing? : Total 6 Click Score: 11    End of Session  Activity Tolerance: Patient tolerated treatment well;Patient limited by lethargy Patient left: in bed;with call bell/phone within reach;with chair alarm set;with family/visitor present (spouse at the bedside) Nurse Communication: Mobility status PT Visit Diagnosis: Unsteadiness on feet (R26.81);Muscle weakness (generalized) (M62.81);Difficulty in walking, not elsewhere classified (R26.2)    Time: 3710-6269 PT Time Calculation (min) (ACUTE ONLY): 31 min   Charges:   PT Evaluation $PT Eval Moderate Complexity: 1 Mod PT Treatments $Therapeutic Activity: 8-22 mins        Donna Bernard, PT, MPT   Ina Homes 05/14/2021, 9:36 AM

## 2021-05-15 DIAGNOSIS — F32A Depression, unspecified: Secondary | ICD-10-CM

## 2021-05-15 LAB — BASIC METABOLIC PANEL
Anion gap: 7 (ref 5–15)
BUN: 13 mg/dL (ref 8–23)
CO2: 26 mmol/L (ref 22–32)
Calcium: 9.1 mg/dL (ref 8.9–10.3)
Chloride: 104 mmol/L (ref 98–111)
Creatinine, Ser: 0.83 mg/dL (ref 0.61–1.24)
GFR, Estimated: >60 mL/min (ref >60)
Glucose, Bld: 145 mg/dL — ABNORMAL HIGH (ref 70–99)
Potassium: 4 mmol/L (ref 3.5–5.1)
Sodium: 137 mmol/L (ref 135–145)

## 2021-05-15 MED ORDER — METOPROLOL TARTRATE 25 MG PO TABS: 25.0000 mg | ORAL_TABLET | Freq: Two times a day (BID) | ORAL | 0 refills | Status: AC

## 2021-05-15 MED ORDER — APIXABAN 5 MG PO TABS: 5.0000 mg | ORAL_TABLET | Freq: Two times a day (BID) | ORAL | 0 refills | Status: AC

## 2021-05-15 NOTE — TOC Progression Note (Signed)
Transition of Care [facility]) - Progression Note    Patient Details  Name: Stanley Sutton MRN: [id number] Date of Birth: 1932-02-07  Transition of Care [facility]) CM/SW Contact  Gildardo Griffes, [place] Phone Number: 05/15/2021, 10:10 AM  Clinical Narrative:     CSW spoke with patient's spouse Stanley Sutton who states she was at hospital at bedside last night when [facility] completed their assessment and concluded patient needs SNF prior to returning. Wife is agreeable to [facility],CSW is reaching out to see if they have bed availability today.  CSW notes per Stanley Sutton, she expressed that she spoke with unit nursing director . [employer] has ordered a neuro consult and expressed need for continued workup of patient prior to discharge, Stanley Sutton is uncertain if patient can be discharged today due to this and was also curious about MRI results.   CSW has informed MD Mayford Knife of above and requested she reach out to discuss with patient's spouse.     Expected Discharge Plan:  (to be determined)    Expected Discharge Plan and Services Expected Discharge Plan:  (to be determined)       Living arrangements for the past 2 months:  [facility])                                       Social Determinants of Health (SDOH) Interventions    Readmission Risk Interventions No flowsheet data found.

## 2021-05-15 NOTE — Progress Notes (Signed)
Report called to [facility]. Wife at bedside awaiting transport to pick him up. Patient currently sleeping with no complaints at this time.

## 2021-05-15 NOTE — Care Management Important Message (Signed)
Important Message  Patient Details  Name: Stanley Sutton MRN: [id number] Date of Birth: 06/13/1932   Medicare Important Message Given:  Yes  No family in room upon visit.  Copy of Medicare IM left on windowsill.  Called and reviewed Medicare IM with spouse, Almon Whitford.    Johnell Comings 05/15/2021, 2:03 PM

## 2021-05-15 NOTE — Discharge Summary (Signed)
Physician Discharge Summary  Logon Uttech ZOX:[id number] DOB: October 13, 1931 DOA: 05/12/2021  PCP: [facility]  Admit date: 05/12/2021 Discharge date: 05/15/2021  Admitted From: home facility  Disposition: home facility   Recommendations for Outpatient Follow-up:  Follow up with PCP in 1-2 weeks F/u w/ cardio, Dr. Gwen Pounds, in 1 week F/u w/ neuro in 1-2 weeks  Home Health: no  Equipment/Devices:  Discharge Condition: CODE STATUS: DNR  Diet recommendation: Heart Healthy  Brief/Interim Summary: HPI was taken from Dr. Joylene Igo: Stanley Sutton is a 87 y.o. male with medical history significant for dementia, dyslipidemia, ischemic optic neuropathy who was brought into the ER by EMS for evaluation of right-sided weakness following a fall. Patient's wife states that she got a call in the early hours of the morning 1 day prior to his admission and was notified that he fell.  When she arrived at the nursing home she noticed that he was weak and unable to use his right and unable to bear weight on his right leg.  He was last seen normal by his wife about a week ago. I am unable to do review of systems on this patient. Labs show sodium 136, potassium 3.4, chloride 98, bicarb 25, glucose 165, BUN 14, creatinine 0.79, calcium 9.9, troponin 21 >> 78, white count 11.2, hemoglobin 14.5, hematocrit 42.4, MCV 94.4, RDW 13.3, platelet count 243, PT 14.6, INR 1.1 CT scan of the head without contrast shows no acute intracranial pathology.  Signs of atrophy and chronic microvascular ischemic change. Cervical spine CT shows no acute fracture or dislocation.  Multilevel degenerative changes Chest x-ray reviewed by me shows no evidence of acute cardiopulmonary disease. Patient had extensive imaging of the right hand which is negative. MRI of the brain without contrast was limited because patient was unable to complete study.  On diffusion weighted imaging there is no evidence of an acute  infarct. Twelve-lead EKG reviewed by me shows atrial fibrillation     ED Course: Patient is an 87 year old male with a history of dementia who was brought into the ER by EMS for evaluation of right-sided weakness following a fall. Patient's wife stated that he was unable to use his right hand and has been unable to bear weight on his right leg since that fall and at baseline patient ambulates with a Rollator. He had an upward trending troponin.  Twelve-lead EKG shows atrial fibrillation. Patient was started on a heparin drip in the ER and will be admitted to the hospital for further evaluation.   As per Dr. Lucianne Muss: This 87 years old male with PMH significant for dementia, dyslipidemia, ischemic optic neuropathy presented in the ED for the evaluation of right-sided weakness following a fall.  Patient's wife reports that she received a call from memory care unit that he has fallen, after she arrived she noticed he was unable to use his right side and unable to bear weight on his right leg.  Patient was last seen normal by his wife a week ago. CT head without any acute intracranial pathology.  CT C-spine no fracture or dislocation.  Patient had extensive imaging of right hand which is negative.  MRI brain without contrast: No evidence of acute infarct.  EKG shows A. fib.   Patient was admitted for A. fib with RVR and started on IV heparin. Cardiology is consulted.  Patient's heart rate remains controlled with metoprolol.  Patient is started on Eliquis.  Heparin discontinued.  Patient is medically cleared to be discharged awaiting  return back to memory care unit.  Discharge Diagnoses:  Principal Problem:   Unspecified atrial fibrillation (HCC) Active Problems:   NSTEMI (non-ST elevated myocardial infarction) (HCC)   Dementia without behavioral disturbance (HCC)   Fall  A. fib: with RVR. New onset. Continue on metoprolol, eliquis. Echo shows EF 55-60%, no regional wall motion abnormalities. Will f/u  w/ cardio, Dr. Gwen Pounds, in 1 week    Elevated troponin: likely secondary to demand ischemia.   Right arm pain and weakness: s/p fall.  Imaging was neg    Dementia: continue w/ supportive care   Depression: severity unknown. Continue on home dose of remeron  Anxiety: severity unknown. Continue on home dose of lorazepam   Discharge Instructions  Discharge Instructions     Amb referral to [facility]   Complete by: As directed    Diet - low sodium heart healthy   Complete by: As directed    Discharge instructions   Complete by: As directed    F/u w/ PCP in 1-2 weeks. F/u w/ cardio, Dr. Gwen Pounds, in 1 week. F/u w/ neuro in 1-2 weeks   Increase activity slowly   Complete by: As directed       Allergies as of 05/15/2021   No Known Allergies      Medication List     TAKE these medications    ammonium lactate 12 % lotion Commonly known as: LAC-HYDRIN APPLY TO BOTH LEGS BELOW THE KNEES EVERY DAY   apixaban 5 MG Tabs tablet Commonly known as: ELIQUIS Take 1 tablet (5 mg total) by mouth 2 (two) times daily.   LORazepam 0.5 MG tablet Commonly known as: ATIVAN Take 0.5 mg by mouth 3 (three) times daily as needed for agitation.   metoprolol tartrate 25 MG tablet Commonly known as: LOPRESSOR Take 1 tablet (25 mg total) by mouth 2 (two) times daily.   mirtazapine 15 MG tablet Commonly known as: REMERON Take 15 mg by mouth at bedtime.   omeprazole 20 MG capsule Commonly known as: PRILOSEC Take 20 mg by mouth daily.   risperiDONE 0.5 MG tablet Commonly known as: RISPERDAL Take 0.5 mg by mouth at bedtime.   simvastatin 20 MG tablet Commonly known as: ZOCOR Take 20 mg by mouth daily.   traZODone 50 MG tablet Commonly known as: DESYREL Take 50 mg by mouth at bedtime.   vitamin B-12 1000 MCG tablet Commonly known as: CYANOCOBALAMIN Take 1,000 mcg by mouth daily.   Vitamin D3 1.25 MG (50000 UT) Tabs Take 50,000 Units by mouth once a week.        No Known  Allergies  Consultations: Cardio    Procedures/Studies: DG Shoulder Right  Result Date: 05/11/2021 CLINICAL DATA:  Fall, pain EXAM: RIGHT SHOULDER - 2+ VIEW COMPARISON:  None. FINDINGS: Degenerative changes in the right shoulder with joint space narrowing and spurring. Prior resection of the distal clavicle. No acute bony abnormality. Specifically, no fracture, subluxation, or dislocation. IMPRESSION: Advanced degenerative changes.  No acute bony abnormality. Electronically Signed   By: Charlett Nose M.D.   On: 05/11/2021 20:18   DG Forearm Right  Result Date: 05/11/2021 CLINICAL DATA:  Fall, pain EXAM: RIGHT FOREARM - 2 VIEW COMPARISON:  None. FINDINGS: Advanced degenerative changes at the right elbow. Extensive spurring. Intra-articular loose bodies present. No visible fracture. No subluxation or dislocation. IMPRESSION: No acute bony abnormality. Electronically Signed   By: Charlett Nose M.D.   On: 05/11/2021 20:17   DG Wrist Complete Right  Result  Date: 05/11/2021 CLINICAL DATA:  Fall, pain EXAM: RIGHT WRIST - COMPLETE 3+ VIEW COMPARISON:  None. FINDINGS: Mild degenerative changes in the wrist. No acute bony abnormality. Specifically, no fracture, subluxation, or dislocation. IMPRESSION: No acute bony abnormality. Electronically Signed   By: Charlett Nose M.D.   On: 05/11/2021 20:17   DG Knee 1-2 Views Right  Result Date: 05/12/2021 CLINICAL DATA:  87 year old male status post fall yesterday with pain. EXAM: RIGHT KNEE - 1-2 VIEW COMPARISON:  None. FINDINGS: Total knee arthroplasty. Hardware appears intact and normally aligned. Postoperative changes to the patella. No definite joint effusion on cross-table lateral view. Calcified peripheral vascular disease. No acute osseous abnormality identified. IMPRESSION: Right knee total arthroplasty. No acute fracture or dislocation identified. Electronically Signed   By: Odessa Fleming M.D.   On: 05/12/2021 10:02   CT HEAD WO CONTRAST  Result Date:  05/11/2021 CLINICAL DATA:  Status post fall.  87 year old male. EXAM: CT HEAD WITHOUT CONTRAST TECHNIQUE: Contiguous axial images were obtained from the base of the skull through the vertex without intravenous contrast. COMPARISON:  August 02, 2020. FINDINGS: Brain: No evidence of acute infarction, hemorrhage, hydrocephalus, extra-axial collection or mass lesion/mass effect. Signs of atrophy and chronic microvascular ischemic change as before. Vascular: No hyperdense vessel or unexpected calcification. Skull: Normal. Negative for fracture or focal lesion. Sinuses/Orbits: Visualized paranasal sinuses and orbits are unremarkable. Other: None IMPRESSION: No acute intracranial pathology. Signs of atrophy and chronic microvascular ischemic change as before. Electronically Signed   By: Donzetta Kohut M.D.   On: 05/11/2021 14:25   CT Cervical Spine Wo Contrast  Result Date: 05/11/2021 CLINICAL DATA:  Neck trauma in a 87 year old male. EXAM: CT CERVICAL SPINE WITHOUT CONTRAST TECHNIQUE: Multidetector CT imaging of the cervical spine was performed without intravenous contrast. Multiplanar CT image reconstructions were also generated. COMPARISON:  Comparison made with head exam of the same date and previous C-spine evaluation from August 02, 2020. FINDINGS: Alignment: Straightening of normal cervical lordotic curvature is similar to the prior study. Minimal anterolisthesis of C6 on C7 in the setting of degenerative changes is a finding that is unchanged. Skull base and vertebrae: No acute fracture. No primary bone lesion or focal pathologic process. Soft tissues and spinal canal: No prevertebral fluid or swelling. No visible canal hematoma. Disc levels: Multilevel degenerative changes similar to the prior study with marked atlantoaxial and atlantooccipital degenerative change as well as disc space narrowing, uncovertebral spurring and facet arthropathy. Upper chest: Negative. Other: None IMPRESSION: No acute fracture or  dislocation in the cervical spine. Multilevel degenerative changes similar to the prior study. Electronically Signed   By: Donzetta Kohut M.D.   On: 05/11/2021 14:14   MR BRAIN WO CONTRAST  Result Date: 05/11/2021 CLINICAL DATA:  Stroke suspected, right leg weakness EXAM: MRI HEAD WITHOUT CONTRAST TECHNIQUE: Multiplanar, multiecho pulse sequences of the brain and surrounding structures were obtained without intravenous contrast. COMPARISON:  No prior brain MRI, correlation is made with 05/11/2021 CT head FINDINGS: Evaluation is somewhat limited as the patient not tolerate the exam, which is incomplete. Only diffusion-weighted imaging and a sagittal T1 sequence were obtained, the sagittal T1 being severely limited by motion artifact. Within this limitation, there is no restricted diffusion to suggest acute infarct. Cerebral volume loss, with commensurate size the ventricles and sulci. IMPRESSION: Limited exam, as the patient was unable to complete the study. On diffusion-weighted imaging, there is no evidence of acute infarct. Electronically Signed   By: Elaina Pattee.D.  On: 05/11/2021 23:58   DG Chest Port 1 View  Result Date: 05/12/2021 CLINICAL DATA:  Fall, lethargy EXAM: PORTABLE CHEST 1 VIEW COMPARISON:  08/02/2020 FINDINGS: Lungs are clear.  No pleural effusion or pneumothorax. Heart is top-normal in size. IMPRESSION: No evidence of acute cardiopulmonary disease. Electronically Signed   By: Charline Bills M.D.   On: 05/12/2021 00:24   DG Humerus Right  Result Date: 05/11/2021 CLINICAL DATA:  Fall, pain EXAM: RIGHT HUMERUS - 2+ VIEW COMPARISON:  None. FINDINGS: No acute bony abnormality. Specifically, no fracture, subluxation, or dislocation. Degenerative changes in the shoulder. Soft tissues are intact. IMPRESSION: No acute bony abnormality. Electronically Signed   By: Charlett Nose M.D.   On: 05/11/2021 20:16   DG Hand Complete Right  Result Date: 05/11/2021 CLINICAL DATA:  Fall, pain  EXAM: RIGHT HAND - COMPLETE 3+ VIEW COMPARISON:  None. FINDINGS: No acute bony abnormality. Specifically, no fracture, subluxation, or dislocation. Mild degenerative changes in the IP joints and wrist. IMPRESSION: No acute bony abnormality. Electronically Signed   By: Charlett Nose M.D.   On: 05/11/2021 20:15   ECHOCARDIOGRAM COMPLETE  Result Date: 05/13/2021    ECHOCARDIOGRAM REPORT   Patient Name:   Stanley Sutton Date of Exam: 05/13/2021 Medical Rec #:  [id number]        Height:       69.0 in Accession #:    [id number]       Weight:       186.0 lb Date of Birth:  [birth year]       BSA:          2.003 m Patient Age:    88 years         BP:           113/87 mmHg Patient Gender: M                HR:           76 bpm. Exam Location:  [facility] Procedure: 2D Echo, Cardiac Doppler and Color Doppler Indications:     Atrial Fibrillation I48.91  History:         Patient has no prior history of Echocardiogram examinations.                  Dementia.  Sonographer:     Cristela Blue Referring Phys:  [id number] AGBATA Diagnosing Phys: Arnoldo Hooker MD  Sonographer Comments: Technically challenging study due to limited acoustic windows and no parasternal window. IMPRESSIONS  1. Left ventricular ejection fraction, by estimation, is 55 to 60%. The left ventricle has normal function. The left ventricle has no regional wall motion abnormalities. Left ventricular diastolic parameters were normal.  2. Right ventricular systolic function is normal. The right ventricular size is normal.  3. The mitral valve is normal in structure. Mild mitral valve regurgitation.  4. The aortic valve is normal in structure. Aortic valve regurgitation is not visualized. FINDINGS  Left Ventricle: Left ventricular ejection fraction, by estimation, is 55 to 60%. The left ventricle has normal function. The left ventricle has no regional wall motion abnormalities. The left ventricular internal cavity size was normal in size. There is  no left ventricular  hypertrophy. Left ventricular diastolic parameters were normal. Right Ventricle: The right ventricular size is normal. No increase in right ventricular wall thickness. Right ventricular systolic function is normal. Left Atrium: Left atrial size was normal in size. Right Atrium: Right atrial size was normal in size. Pericardium: There is  no evidence of pericardial effusion. Mitral Valve: The mitral valve is normal in structure. Mild mitral valve regurgitation. Tricuspid Valve: The tricuspid valve is normal in structure. Tricuspid valve regurgitation is trivial. Aortic Valve: The aortic valve is normal in structure. Aortic valve regurgitation is not visualized. Aortic valve mean gradient measures 3.7 mmHg. Aortic valve peak gradient measures 6.8 mmHg. Aortic valve area, by VTI measures 2.03 cm. Pulmonic Valve: The pulmonic valve was normal in structure. Pulmonic valve regurgitation is not visualized. Aorta: The aortic root and ascending aorta are structurally normal, with no evidence of dilitation. IAS/Shunts: No atrial level shunt detected by color flow Doppler.  LEFT VENTRICLE PLAX 2D LVIDd:         4.09 cm   Diastology LVIDs:         2.71 cm   LV e' medial:    6.09 cm/s LV PW:         0.97 cm   LV E/e' medial:  15.2 LV IVS:        0.77 cm   LV e' lateral:   7.07 cm/s LVOT diam:     2.00 cm   LV E/e' lateral: 13.1 LV SV:         52 LV SV Index:   26 LVOT Area:     3.14 cm  RIGHT VENTRICLE RV Basal diam:  3.24 cm RV S prime:     12.40 cm/s TAPSE (M-mode): 3.8 cm LEFT ATRIUM             Index        RIGHT ATRIUM           Index LA diam:        3.10 cm 1.55 cm/m   RA Area:     11.10 cm LA Vol (A2C):   24.6 ml 12.28 ml/m  RA Volume:   26.00 ml  12.98 ml/m LA Vol (A4C):   31.0 ml 15.47 ml/m LA Biplane Vol: 30.4 ml 15.18 ml/m  AORTIC VALVE AV Area (Vmax):    1.87 cm AV Area (Vmean):   1.96 cm AV Area (VTI):     2.03 cm AV Vmax:           130.33 cm/s AV Vmean:          88.033 cm/s AV VTI:            0.256 m AV  Peak Grad:      6.8 mmHg AV Mean Grad:      3.7 mmHg LVOT Vmax:         77.70 cm/s LVOT Vmean:        54.900 cm/s LVOT VTI:          0.165 m LVOT/AV VTI ratio: 0.65  AORTA Ao Root diam: 3.20 cm MITRAL VALVE               TRICUSPID VALVE MV Area (PHT): 3.11 cm    TR Peak grad:   8.3 mmHg MV Decel Time: 244 msec    TR Vmax:        144.00 cm/s MV E velocity: 92.50 cm/s MV A velocity: 96.40 cm/s  SHUNTS MV E/A ratio:  0.96        Systemic VTI:  0.16 m                            Systemic Diam: 2.00 cm Arnoldo Hooker MD Electronically signed by Arnoldo Hooker MD  Signature Date/Time: 05/13/2021/1:31:58 PM    Final    DG HIP UNILAT WITH PELVIS 2-3 VIEWS RIGHT  Result Date: 05/12/2021 CLINICAL DATA:  Larey Seat yesterday.  Hip pain. EXAM: DG HIP (WITH OR WITHOUT PELVIS) 2-3V RIGHT COMPARISON:  None. FINDINGS: No evidence of pelvic or hip fracture. Lower lumbar degenerative changes incidentally noted. IMPRESSION: No acute or traumatic finding. Electronically Signed   By: Paulina Fusi M.D.   On: 05/12/2021 10:01   (Echo, Carotid, EGD, Colonoscopy, ERCP)    Subjective: Pt is pleasantly confused    Discharge Exam: Vitals:   05/15/21 0742 05/15/21 1159  BP: (!) 129/54 (!) 110/54  Pulse: 80 80  Resp: 17 18  Temp: 98.3 F (36.8 C) 98.4 F (36.9 C)  SpO2: 97% 98%   Vitals:   05/15/21 0014 05/15/21 0417 05/15/21 0742 05/15/21 1159  BP: (!) 148/59 (!) 153/58 (!) 129/54 (!) 110/54  Pulse: 81 77 80 80  Resp: 18 18 17 18   Temp: 98 F (36.7 C) 99.2 F (37.3 C) 98.3 F (36.8 C) 98.4 F (36.9 C)  TempSrc:   Axillary Axillary  SpO2: 100% 97% 97% 98%  Weight:      Height:        General: Pt is alert, awake, not in acute distress. Frail appearing  Cardiovascular: S1/S2 +, no rubs, no gallops Respiratory: CTA bilaterally, no wheezing, no rhonchi Abdominal: Soft, NT, ND, bowel sounds + Extremities:  no cyanosis    The results of significant diagnostics from this hospitalization (including imaging,  microbiology, ancillary and laboratory) are listed below for reference.     Microbiology: Recent Results (from the past 240 hour(s))  Resp Panel by RT-PCR (Flu A&B, Covid) Nasopharyngeal Swab     Status: None   Collection Time: 05/13/21  7:53 AM   Specimen: Nasopharyngeal Swab; Nasopharyngeal(NP) swabs in vial transport medium  Result Value Ref Range Status   SARS Coronavirus 2 by RT PCR NEGATIVE NEGATIVE Final    Comment: (NOTE) SARS-CoV-2 target nucleic acids are NOT DETECTED.  The SARS-CoV-2 RNA is generally detectable in upper respiratory specimens during the acute phase of infection. The lowest concentration of SARS-CoV-2 viral copies this assay can detect is 138 copies/mL. A negative result does not preclude SARS-Cov-2 infection and should not be used as the sole basis for treatment or other patient management decisions. A negative result may occur with  improper specimen collection/handling, submission of specimen other than nasopharyngeal swab, presence of viral mutation(s) within the areas targeted by this assay, and inadequate number of viral copies(<138 copies/mL). A negative result must be combined with clinical observations, patient history, and epidemiological information. The expected result is Negative.  Fact Sheet for Patients:  05/15/21  Fact Sheet for Healthcare Providers:  BloggerCourse.com  This test is no t yet approved or cleared by the [place] FDA and  has been authorized for detection and/or diagnosis of SARS-CoV-2 by FDA under an Emergency Use Authorization (EUA). This EUA will remain  in effect (meaning this test can be used) for the duration of the COVID-19 declaration under Section 564(b)(1) of the Act, 21 U.S.C.section 360bbb-3(b)(1), unless the authorization is terminated  or revoked sooner.       Influenza A by PCR NEGATIVE NEGATIVE Final   Influenza B by PCR NEGATIVE NEGATIVE  Final    Comment: (NOTE) The Xpert Xpress SARS-CoV-2/FLU/RSV plus assay is intended as an aid in the diagnosis of influenza from Nasopharyngeal swab specimens and should not be used as a sole  basis for treatment. Nasal washings and aspirates are unacceptable for Xpert Xpress SARS-CoV-2/FLU/RSV testing.  Fact Sheet for Patients: BloggerCourse.com  Fact Sheet for Healthcare Providers: [employer]ious[employer]ker[employer]it  This test is not yet approved or cleared by the [place] FDA and has been authorized for detection and/or diagnosis of SARS-CoV-2 by FDA under an Emergency Use Authorization (EUA). This EUA will remain in effect (meaning this test can be used) for the duration of the COVID-19 declaration under Section 564(b)(1) of the Act, 21 U.S.C. section 360bbb-3(b)(1), unless the authorization is terminated or revoked.  Performed at [facility], [address] [place], [place] [address]      Labs: BNP (last 3 results) No results for input(s): BNP in the last 8760 hours. Basic Metabolic Panel: Recent Labs  Lab 05/11/21 1613 05/13/21 0838 05/14/21 0603 05/15/21 0459  NA 136 136 135 137  K 3.4* 3.4* 3.3* 4.0  CL 98 105 104 104  CO2 25 22 26 26   GLUCOSE 165* 119* 122* 145*  BUN 14 14 14 13   CREATININE 0.79 0.71 0.76 0.83  CALCIUM 9.9 9.4 9.0 9.1  MG  --   --  2.0  --   PHOS  --   --  2.7  --    Liver Function Tests: No results for input(s): AST, ALT, ALKPHOS, BILITOT, PROT, ALBUMIN in the last 168 hours. No results for input(s): LIPASE, AMYLASE in the last 168 hours. No results for input(s): AMMONIA in the last 168 hours. CBC: Recent Labs  Lab 05/11/21 1613 05/13/21 0838 05/14/21 0603  WBC 11.2* 11.2* 9.9  HGB 14.5 13.1 12.6*  HCT 42.4 37.8* 37.6*  MCV 94.4 92.4 92.6  PLT 243 225 235   Cardiac Enzymes: Recent Labs  Lab 05/12/21 1345  CKTOTAL 396   BNP: Invalid input(s): POCBNP CBG: No results for  input(s): GLUCAP in the last 168 hours. D-Dimer No results for input(s): DDIMER in the last 72 hours. Hgb A1c No results for input(s): HGBA1C in the last 72 hours. Lipid Profile Recent Labs    05/13/21 0838  CHOL 105  HDL 48  LDLCALC 47  TRIG 49  CHOLHDL 2.2   Thyroid function studies Recent Labs    05/12/21 1345  TSH 0.683   Anemia work up No results for input(s): VITAMINB12, FOLATE, FERRITIN, TIBC, IRON, RETICCTPCT in the last 72 hours. Urinalysis    Component Value Date/Time   COLORURINE YELLOW (A) 05/12/2021 0051   APPEARANCEUR CLEAR (A) 05/12/2021 0051   LABSPEC 1.015 05/12/2021 0051   PHURINE 5.0 05/12/2021 0051   GLUCOSEU >=500 (A) 05/12/2021 0051   HGBUR SMALL (A) 05/12/2021 0051   BILIRUBINUR NEGATIVE 05/12/2021 0051   KETONESUR 20 (A) 05/12/2021 0051   PROTEINUR 30 (A) 05/12/2021 0051   NITRITE NEGATIVE 05/12/2021 0051   LEUKOCYTESUR NEGATIVE 05/12/2021 0051   Sepsis Labs Invalid input(s): PROCALCITONIN,  WBC,  LACTICIDVEN Microbiology Recent Results (from the past 240 hour(s))  Resp Panel by RT-PCR (Flu A&B, Covid) Nasopharyngeal Swab     Status: None   Collection Time: 05/13/21  7:53 AM   Specimen: Nasopharyngeal Swab; Nasopharyngeal(NP) swabs in vial transport medium  Result Value Ref Range Status   SARS Coronavirus 2 by RT PCR NEGATIVE NEGATIVE Final    Comment: (NOTE) SARS-CoV-2 target nucleic acids are NOT DETECTED.  The SARS-CoV-2 RNA is generally detectable in upper respiratory specimens during the acute phase of infection. The lowest concentration of SARS-CoV-2 viral copies this assay can detect is 138 copies/mL. A negative  result does not preclude SARS-Cov-2 infection and should not be used as the sole basis for treatment or other patient management decisions. A negative result may occur with  improper specimen collection/handling, submission of specimen other than nasopharyngeal swab, presence of viral mutation(s) within the areas  targeted by this assay, and inadequate number of viral copies(<138 copies/mL). A negative result must be combined with clinical observations, patient history, and epidemiological information. The expected result is Negative.  Fact Sheet for Patients:  BloggerCourse.com  Fact Sheet for Healthcare Providers:  [employer]  This test is no t yet approved or cleared by the [place] FDA and  has been authorized for detection and/or diagnosis of SARS-CoV-2 by FDA under an Emergency Use Authorization (EUA). This EUA will remain  in effect (meaning this test can be used) for the duration of the COVID-19 declaration under Section 564(b)(1) of the Act, 21 U.S.C.section 360bbb-3(b)(1), unless the authorization is terminated  or revoked sooner.       Influenza A by PCR NEGATIVE NEGATIVE Final   Influenza B by PCR NEGATIVE NEGATIVE Final    Comment: (NOTE) The Xpert Xpress SARS-CoV-2/FLU/RSV plus assay is intended as an aid in the diagnosis of influenza from Nasopharyngeal swab specimens and should not be used as a sole basis for treatment. Nasal washings and aspirates are unacceptable for Xpert Xpress SARS-CoV-2/FLU/RSV testing.  Fact Sheet for Patients: BloggerCourse.com  Fact Sheet for Healthcare Providers: SeriousBroker.it  This test is not yet approved or cleared by the [place] FDA and has been authorized for detection and/or diagnosis of SARS-CoV-2 by FDA under an Emergency Use Authorization (EUA). This EUA will remain in effect (meaning this test can be used) for the duration of the COVID-19 declaration under Section 564(b)(1) of the Act, 21 U.S.C. section 360bbb-3(b)(1), unless the authorization is terminated or revoked.  Performed at [facility], [address] [place], [place] [address]      Time coordinating discharge: Over 30  minutes  SIGNED:   Charise Killian, MD  [facility] 05/15/2021, 1:30 PM Pager   If 7PM-7AM, please contact night-coverage

## 2021-05-15 NOTE — TOC Transition Note (Signed)
Transition of Care [facility]) - CM/SW Discharge Note   Patient Details  Name: Stanley Sutton MRN: [id number] Date of Birth: October 11, 1931  [facility] CM/SW Contact:  Gildardo Griffes, LCSW Phone Number: 05/15/2021, 2:18 PM   Clinical Narrative:     Patient will DC to: [facility] Anticipated DC date: 05/15/21 Family notified: spouse Transport by: Wendie Simmer  Per MD patient ready for DC to [facility] . RN, patient, patient's family, and facility notified of DC. Discharge Summary sent to facility. RN given number for report  [phone] Room 607 A. DC packet on chart. Ambulance transport requested for patient.  CSW signing off.  Angeline Slim, LCSW    Final next level of care: Skilled Nursing Facility Barriers to Discharge: No Barriers Identified   Patient Goals and CMS Choice Patient states their goals for this hospitalization and ongoing recovery are:: to go home CMS Medicare.gov Compare Post Acute Care list provided to:: Patient Represenative (must comment) (spouse) Choice offered to / list presented to : Spouse  Discharge Placement              Patient chooses bed at: [facility] [place] Patient to be transferred to facility by: [facility] Name of family member notified: spouse Patient and family notified of of transfer: 05/15/21  Discharge Plan and Services                                     Social Determinants of Health (SDOH) Interventions     Readmission Risk Interventions No flowsheet data found.

## 2021-06-25 DEATH — deceased

## 2022-02-09 IMAGING — CR DG KNEE 1-2V*R*
1 series · 2 of 2 positions shown · non-contrast
Comparison: None.

CLINICAL DATA: 88-year-old male status post fall yesterday with
pain.

EXAM:
RIGHT KNEE - 1-2 VIEW

[Series 1: dg knee 1-2 views right · 0.14mm/px · 2 of 2 slices shown]
[im 1/2]
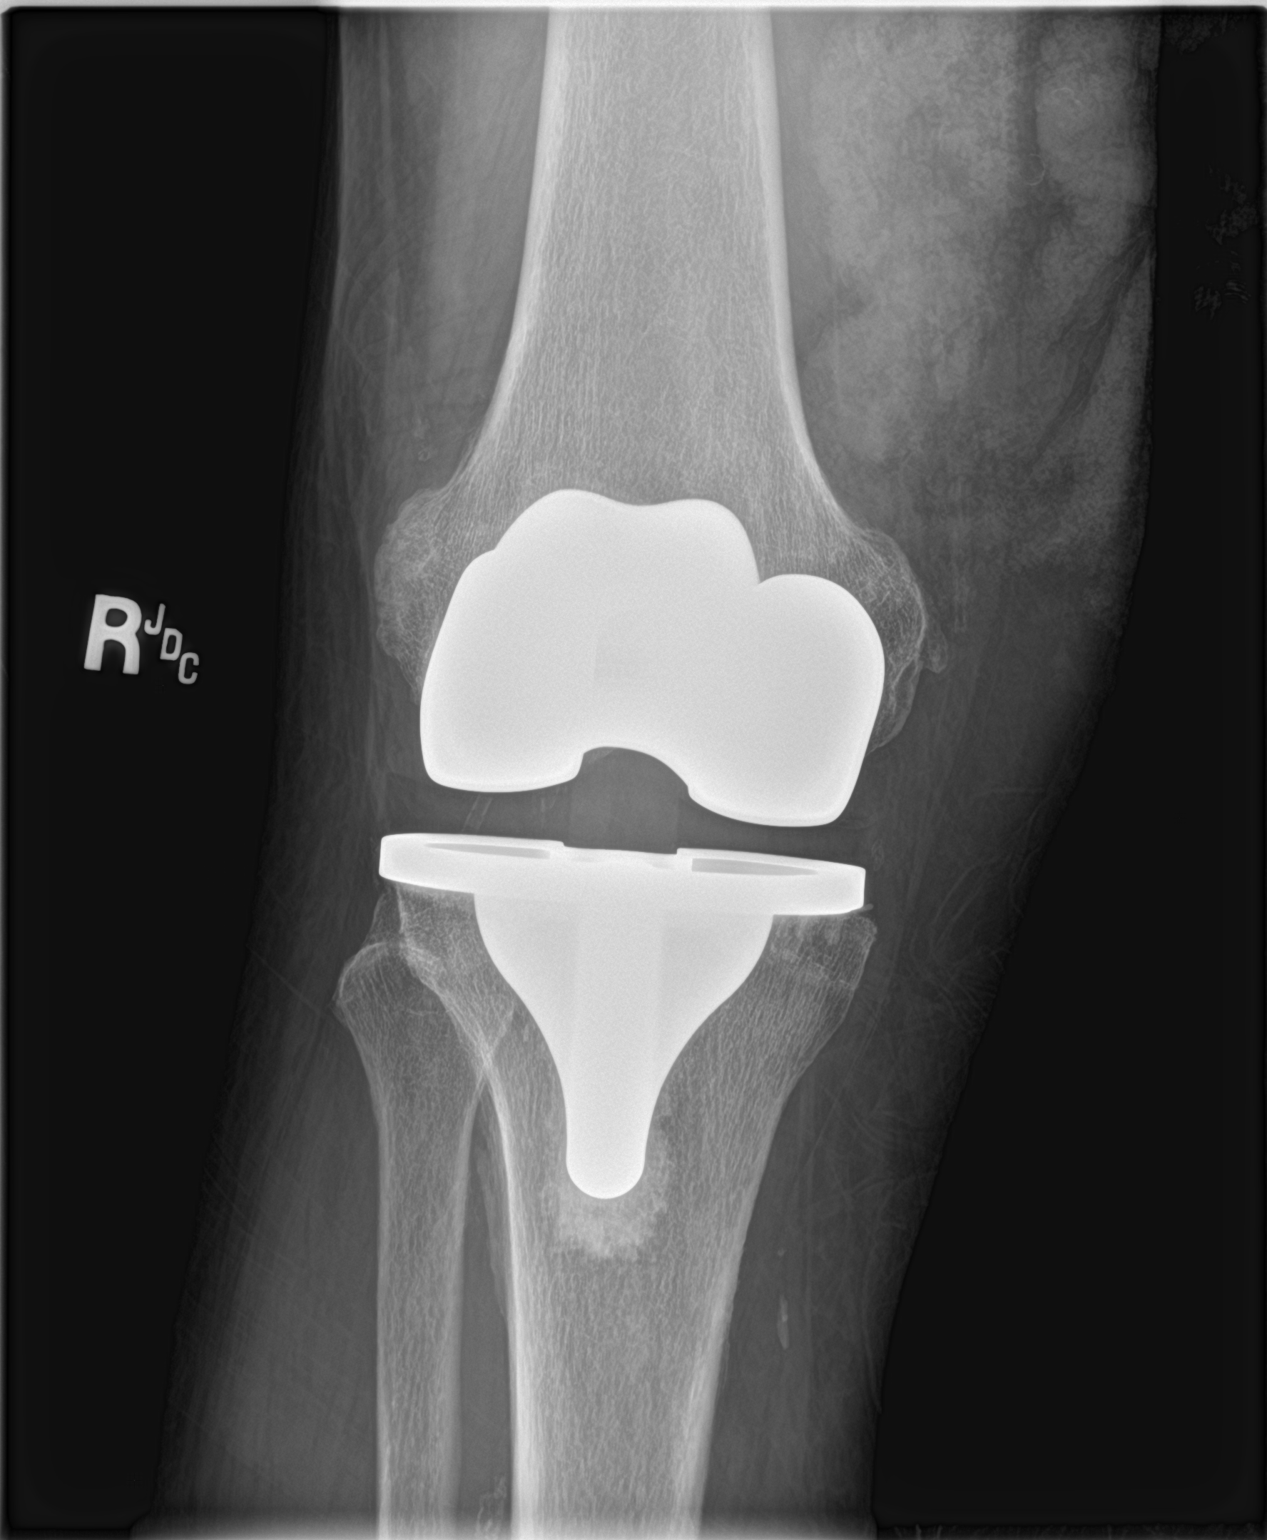
[im 2/2]
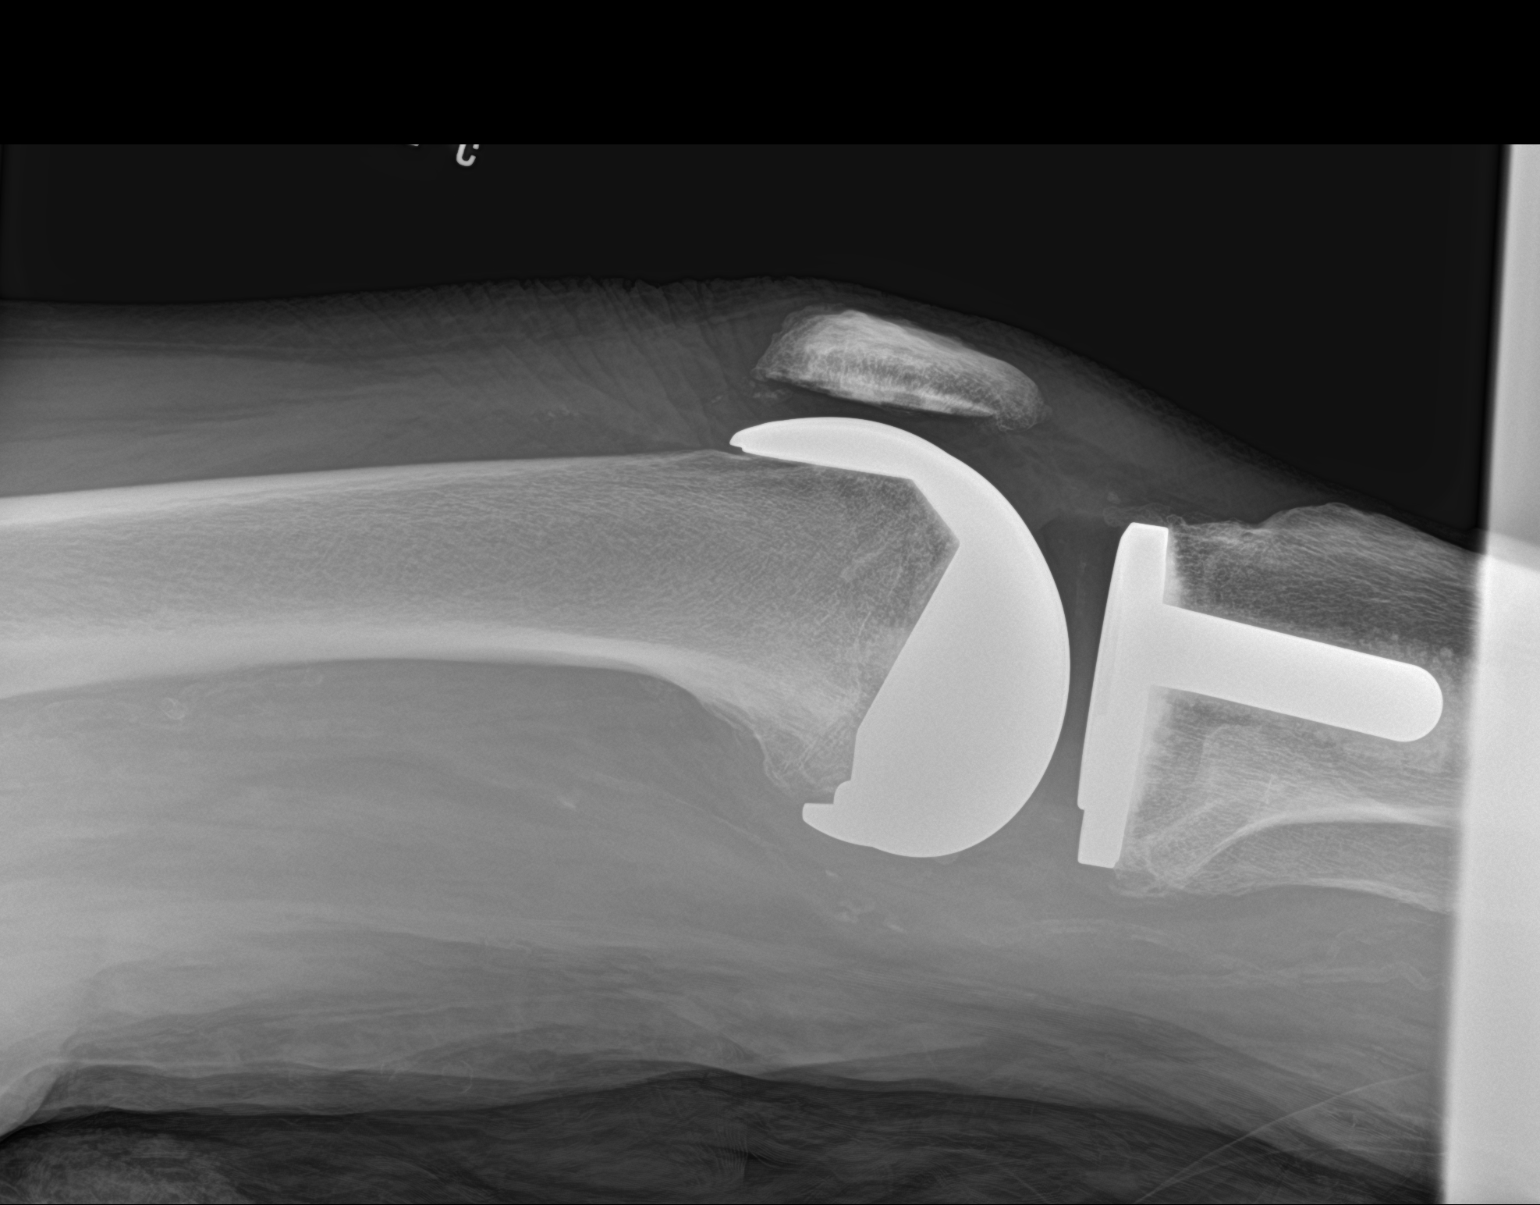

[2 of 2 positions shown; findings below may reference images not displayed]

FINDINGS: Total knee arthroplasty. Hardware appears intact and normally
aligned. Postoperative changes to the patella. No definite joint
effusion on cross-table lateral view. Calcified peripheral vascular
disease. No acute osseous abnormality identified.
IMPRESSION: Right knee total arthroplasty. No acute fracture or dislocation
identified.
